# Patient Record
Sex: Female | Born: 1995 | Race: Black or African American | Hispanic: No | Marital: Single | Smoking: Former smoker
Health system: Southern US, Community
[De-identification: ages and names within clinical notes are randomized; demographics above are authoritative.]

## PROBLEM LIST (undated history)

## (undated) DIAGNOSIS — F419 Anxiety disorder, unspecified: Secondary | ICD-10-CM

## (undated) DIAGNOSIS — N739 Female pelvic inflammatory disease, unspecified: Secondary | ICD-10-CM

## (undated) DIAGNOSIS — F32A Depression, unspecified: Secondary | ICD-10-CM

## (undated) DIAGNOSIS — F329 Major depressive disorder, single episode, unspecified: Secondary | ICD-10-CM

## (undated) HISTORY — PX: NO PAST SURGERIES: SHX2092

---

## 2014-07-11 ENCOUNTER — Encounter (HOSPITAL_COMMUNITY): Payer: Self-pay | Admitting: Emergency Medicine

## 2014-07-11 ENCOUNTER — Emergency Department (HOSPITAL_COMMUNITY)
Admission: EM | Admit: 2014-07-11 | Discharge: 2014-07-11 | Disposition: A | Discharge status: Home / Self Care | Payer: BLUE CROSS/BLUE SHIELD | Attending: Emergency Medicine | Admitting: Emergency Medicine

## 2014-07-11 DIAGNOSIS — R109 Unspecified abdominal pain: Secondary | ICD-10-CM | POA: Insufficient documentation

## 2014-07-11 DIAGNOSIS — Z3202 Encounter for pregnancy test, result negative: Secondary | ICD-10-CM | POA: Diagnosis not present

## 2014-07-11 DIAGNOSIS — Z720 Tobacco use: Secondary | ICD-10-CM | POA: Insufficient documentation

## 2014-07-11 DIAGNOSIS — R110 Nausea: Secondary | ICD-10-CM | POA: Diagnosis not present

## 2014-07-11 LAB — BASIC METABOLIC PANEL
Anion gap: 7 (ref 5–15)
BUN: 9 mg/dL (ref 6–20)
CHLORIDE: 107 mmol/L (ref 101–111)
CO2: 24 mmol/L (ref 22–32)
Calcium: 8.6 mg/dL — ABNORMAL LOW (ref 8.9–10.3)
Creatinine, Ser: 0.66 mg/dL (ref 0.44–1.00)
GLUCOSE: 87 mg/dL (ref 65–99)
POTASSIUM: 3.5 mmol/L (ref 3.5–5.1)
SODIUM: 138 mmol/L (ref 135–145)

## 2014-07-11 LAB — CBC WITH DIFFERENTIAL/PLATELET
Basophils Absolute: 0 10*3/uL (ref 0.0–0.1)
Basophils Relative: 0 % (ref 0–1)
Eosinophils Absolute: 0.1 10*3/uL (ref 0.0–0.7)
Eosinophils Relative: 2 % (ref 0–5)
HCT: 37.4 % (ref 36.0–46.0)
Hemoglobin: 12.6 g/dL (ref 12.0–15.0)
LYMPHS PCT: 17 % (ref 12–46)
Lymphs Abs: 0.7 10*3/uL (ref 0.7–4.0)
MCH: 31.7 pg (ref 26.0–34.0)
MCHC: 33.7 g/dL (ref 30.0–36.0)
MCV: 94 fL (ref 78.0–100.0)
Monocytes Absolute: 0.5 10*3/uL (ref 0.1–1.0)
Monocytes Relative: 13 % — ABNORMAL HIGH (ref 3–12)
Neutro Abs: 2.8 10*3/uL (ref 1.7–7.7)
Neutrophils Relative %: 68 % (ref 43–77)
PLATELETS: 220 10*3/uL (ref 150–400)
RBC: 3.98 MIL/uL (ref 3.87–5.11)
RDW: 12.5 % (ref 11.5–15.5)
WBC: 4.1 10*3/uL (ref 4.0–10.5)

## 2014-07-11 LAB — URINE MICROSCOPIC-ADD ON

## 2014-07-11 LAB — URINALYSIS, ROUTINE W REFLEX MICROSCOPIC
BILIRUBIN URINE: NEGATIVE
Glucose, UA: NEGATIVE mg/dL
Ketones, ur: NEGATIVE mg/dL
NITRITE: NEGATIVE
PROTEIN: NEGATIVE mg/dL
SPECIFIC GRAVITY, URINE: 1.033 — AB (ref 1.005–1.030)
Urobilinogen, UA: 0.2 mg/dL (ref 0.0–1.0)
pH: 5.5 (ref 5.0–8.0)

## 2014-07-11 LAB — PREGNANCY, URINE: Preg Test, Ur: NEGATIVE

## 2014-07-11 MED ORDER — PROMETHAZINE HCL 25 MG PO TABS: 25.0000 mg | ORAL_TABLET | Freq: Four times a day (QID) | ORAL | Status: DC | PRN

## 2014-07-11 MED ORDER — ONDANSETRON HCL 4 MG/2ML IJ SOLN
4.0000 mg | Freq: Once | INTRAMUSCULAR | Status: AC
  Administered 2014-07-11: 4 mg via INTRAVENOUS
  Filled 2014-07-11: qty 2

## 2014-07-11 MED ORDER — SODIUM CHLORIDE 0.9 % IV BOLUS (SEPSIS)
1000.0000 mL | Freq: Once | INTRAVENOUS | Status: AC
  Administered 2014-07-11: 1000 mL via INTRAVENOUS

## 2014-07-11 MED ORDER — SODIUM CHLORIDE 0.9 % IV SOLN
INTRAVENOUS | Status: DC
  Administered 2014-07-11: 100 mL/h via INTRAVENOUS

## 2014-07-11 NOTE — ED Notes (Signed)
Bed: WLPT2 Expected date:  Expected time:  Means of arrival:  Comments: EMS 

## 2014-07-11 NOTE — Discharge Instructions (Signed)
Take the Phenergan as directed. 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 Return for any new or worse symptoms or for persistent symptoms. As we discussed it is possible the implant could be causing some symptoms if the nausea improved but persist then consider a discussion with the folks who put the implant in.

## 2014-07-11 NOTE — ED Provider Notes (Signed)
CSN: [id number]     Arrival date & time 07/11/14  1030 History   First MD Initiated Contact with Patient 07/11/14 1230     Chief Complaint  Patient presents with  . Nausea     (Consider location/radiation/quality/duration/timing/severity/associated sxs/prior Treatment) The history is provided by the patient.   19 year old female two-week history of some nausea following of implant. Patient with acute onset of the significant nausea during the night. Never truly had any abdominal pain just a nausea feeling. No real vomiting no diarrhea. Patient states that the nausea is severe.  History reviewed. No pertinent past medical history. History reviewed. No pertinent past surgical history. No family history on file. History  Substance Use Topics  . Smoking status: Current Every Day Smoker  . Smokeless tobacco: Not on file  . Alcohol Use: Yes   OB History    No data available     Review of Systems  Constitutional: Negative for fever.  HENT: Negative for congestion.   Eyes: Negative for redness.  Respiratory: Negative for shortness of breath.   Cardiovascular: Negative for chest pain.  Gastrointestinal: Positive for nausea and abdominal pain. Negative for vomiting and diarrhea.  Genitourinary: Negative for dysuria.  Musculoskeletal: Negative for back pain.  Skin: Negative for rash.  Neurological: Negative for headaches.  Hematological: Does not bruise/bleed easily.  Psychiatric/Behavioral: Negative for confusion.      Allergies  Review of patient's allergies indicates no known allergies.  Home Medications   Prior to Admission medications   Medication Sig Start Date End Date Taking? Authorizing Provider  promethazine (PHENERGAN) 25 MG tablet Take 1 tablet (25 mg total) by mouth every 6 (six) hours as needed for nausea or vomiting. 07/11/14   Vanetta MuldersScott Hobart Marte, MD   BP 106/66 mmHg  Pulse 63  Temp(Src) 99.2 F (37.3 C) (Oral)  Resp 16  SpO2 100% Physical Exam   Constitutional: She is oriented to person, place, and time. She appears well-developed and well-nourished. No distress.  HENT:  Head: Normocephalic and atraumatic.  Mouth/Throat: Oropharynx is clear and moist.  Eyes: Conjunctivae and EOM are normal. Pupils are equal, round, and reactive to light.  Neck: Normal range of motion.  Cardiovascular: Normal rate, regular rhythm and normal heart sounds.   No murmur heard. Pulmonary/Chest: Effort normal and breath sounds normal. No respiratory distress.  Abdominal: Soft. Bowel sounds are normal. There is no tenderness.  Musculoskeletal: Normal range of motion. She exhibits no edema.  Neurological: She is alert and oriented to person, place, and time. No cranial nerve deficit. She exhibits normal muscle tone. Coordination normal.  Skin: Skin is warm. No rash noted.  Nursing note and vitals reviewed.   ED Course  Procedures (including critical care time) Labs Review Labs Reviewed  URINALYSIS, ROUTINE W REFLEX MICROSCOPIC (NOT AT [facility]) - Abnormal; Notable for the following:    Color, Urine AMBER (*)    Specific Gravity, Urine 1.033 (*)    Hgb urine dipstick MODERATE (*)    Leukocytes, UA TRACE (*)    All other components within normal limits  CBC WITH DIFFERENTIAL/PLATELET - Abnormal; Notable for the following:    Monocytes Relative 13 (*)    All other components within normal limits  BASIC METABOLIC PANEL - Abnormal; Notable for the following:    Calcium 8.6 (*)    All other components within normal limits  URINE MICROSCOPIC-ADD ON - Abnormal; Notable for the following:    Squamous Epithelial / LPF FEW (*)    All  other components within normal limits  PREGNANCY, URINE   Results for orders placed or performed during the hospital encounter of 07/11/14  Urinalysis, Routine w reflex microscopic (not at [facility])  Result Value Ref Range   Color, Urine AMBER (A) YELLOW   APPearance CLEAR CLEAR   Specific Gravity, Urine 1.033 (H) 1.005 - 1.030    pH 5.5 5.0 - 8.0   Glucose, UA NEGATIVE NEGATIVE mg/dL   Hgb urine dipstick MODERATE (A) NEGATIVE   Bilirubin Urine NEGATIVE NEGATIVE   Ketones, ur NEGATIVE NEGATIVE mg/dL   Protein, ur NEGATIVE NEGATIVE mg/dL   Urobilinogen, UA 0.2 0.0 - 1.0 mg/dL   Nitrite NEGATIVE NEGATIVE   Leukocytes, UA TRACE (A) NEGATIVE  Pregnancy, urine  Result Value Ref Range   Preg Test, Ur NEGATIVE NEGATIVE  CBC with Differential/Platelet  Result Value Ref Range   WBC 4.1 4.0 - 10.5 K/uL   RBC 3.98 3.87 - 5.11 MIL/uL   Hemoglobin 12.6 12.0 - 15.0 g/dL   HCT 16.1 09.6 - 04.5 %   MCV 94.0 78.0 - 100.0 fL   MCH 31.7 26.0 - 34.0 pg   MCHC 33.7 30.0 - 36.0 g/dL   RDW 40.9 81.1 - 91.4 %   Platelets 220 150 - 400 K/uL   Neutrophils Relative % 68 43 - 77 %   Neutro Abs 2.8 1.7 - 7.7 K/uL   Lymphocytes Relative 17 12 - 46 %   Lymphs Abs 0.7 0.7 - 4.0 K/uL   Monocytes Relative 13 (H) 3 - 12 %   Monocytes Absolute 0.5 0.1 - 1.0 K/uL   Eosinophils Relative 2 0 - 5 %   Eosinophils Absolute 0.1 0.0 - 0.7 K/uL   Basophils Relative 0 0 - 1 %   Basophils Absolute 0.0 0.0 - 0.1 K/uL  Basic metabolic panel  Result Value Ref Range   Sodium 138 135 - 145 mmol/L   Potassium 3.5 3.5 - 5.1 mmol/L   Chloride 107 101 - 111 mmol/L   CO2 24 22 - 32 mmol/L   Glucose, Bld 87 65 - 99 mg/dL   BUN 9 6 - 20 mg/dL   Creatinine, Ser 7.82 0.44 - 1.00 mg/dL   Calcium 8.6 (L) 8.9 - 10.3 mg/dL   GFR calc non Af Amer >60 >60 mL/min   GFR calc Af Amer >60 >60 mL/min   Anion gap 7 5 - 15  Urine microscopic-add on  Result Value Ref Range   Squamous Epithelial / LPF FEW (A) RARE   WBC, UA 3-6 <3 WBC/hpf   RBC / HPF 3-6 <3 RBC/hpf   Bacteria, UA RARE RARE   Urine-Other MUCOUS PRESENT      Imaging Review No results found.   EKG Interpretation None      MDM   Final diagnoses:  Nausea    Patient really didn't have any abdominal pain it was just done nausea. No real vomiting. Patient's symptoms have been present  for a approximately 2 weeks but worse just last night. No abdominal tenderness no acute abdominal findings. Labs without any sniffing abnormalities brings he test negative no evidence urinary tract infection. No leukocytosis. Symptoms could be related to a viral gastritis.  Patient improved here with a hydration and antinausea medicine. Will continue and nausea medicine. In addition patient symptoms could be related to the implant that she had placed the 2 weeks ago and has had the nausea since then. She will follow-up with them if this persists.  Vanetta Mulders, MD 07/11/14 1538

## 2014-07-11 NOTE — ED Notes (Signed)
Pt c/o nausea ever since she got her implant last week. Pt told EMS she smoked marijuana last night and then became ill and vomited. Pt did not mention marijuana use to this RN. Pt denies pain. A&Ox4 and ambulatory.

## 2014-07-11 NOTE — ED Notes (Signed)
Pt presents via EMS with c/o abdominal pain. Per EMS, pt smoked marijuana last night and is now having the pain. When EMS arrived, she was laying outside on the ground flailing around, EMS able to put her on the truck and mom calmed pt down.

## 2014-09-08 ENCOUNTER — Emergency Department (HOSPITAL_COMMUNITY): Payer: BLUE CROSS/BLUE SHIELD

## 2014-09-08 ENCOUNTER — Encounter (HOSPITAL_COMMUNITY): Payer: Self-pay | Admitting: *Deleted

## 2014-09-08 ENCOUNTER — Inpatient Hospital Stay (HOSPITAL_COMMUNITY)
Admission: AD | Admit: 2014-09-08 | Discharge: 2014-09-12 | DRG: 758 | Disposition: A | Discharge status: Home / Self Care | Payer: BLUE CROSS/BLUE SHIELD | Source: Ambulatory Visit | Attending: Obstetrics and Gynecology | Admitting: Obstetrics and Gynecology

## 2014-09-08 DIAGNOSIS — A5402 Gonococcal vulvovaginitis, unspecified: Secondary | ICD-10-CM | POA: Diagnosis present

## 2014-09-08 DIAGNOSIS — R1011 Right upper quadrant pain: Secondary | ICD-10-CM | POA: Diagnosis present

## 2014-09-08 DIAGNOSIS — R109 Unspecified abdominal pain: Secondary | ICD-10-CM | POA: Diagnosis not present

## 2014-09-08 DIAGNOSIS — A5901 Trichomonal vulvovaginitis: Secondary | ICD-10-CM | POA: Diagnosis present

## 2014-09-08 DIAGNOSIS — N730 Acute parametritis and pelvic cellulitis: Secondary | ICD-10-CM | POA: Diagnosis not present

## 2014-09-08 DIAGNOSIS — A568 Sexually transmitted chlamydial infection of other sites: Secondary | ICD-10-CM | POA: Diagnosis present

## 2014-09-08 DIAGNOSIS — R52 Pain, unspecified: Secondary | ICD-10-CM

## 2014-09-08 DIAGNOSIS — F17200 Nicotine dependence, unspecified, uncomplicated: Secondary | ICD-10-CM | POA: Diagnosis present

## 2014-09-08 DIAGNOSIS — N83519 Torsion of ovary and ovarian pedicle, unspecified side: Secondary | ICD-10-CM

## 2014-09-08 HISTORY — DX: Anxiety disorder, unspecified: F41.9

## 2014-09-08 HISTORY — DX: Depression, unspecified: F32.A

## 2014-09-08 HISTORY — DX: Major depressive disorder, single episode, unspecified: F32.9

## 2014-09-08 LAB — COMPREHENSIVE METABOLIC PANEL
ALT: 10 U/L — AB (ref 14–54)
AST: 18 U/L (ref 15–41)
Albumin: 3.9 g/dL (ref 3.5–5.0)
Alkaline Phosphatase: 71 U/L (ref 38–126)
Anion gap: 11 (ref 5–15)
BILIRUBIN TOTAL: 1.2 mg/dL (ref 0.3–1.2)
BUN: 7 mg/dL (ref 6–20)
CALCIUM: 9.1 mg/dL (ref 8.9–10.3)
CO2: 22 mmol/L (ref 22–32)
Chloride: 103 mmol/L (ref 101–111)
Creatinine, Ser: 0.77 mg/dL (ref 0.44–1.00)
GFR calc Af Amer: >60 mL/min (ref >60)
GFR calc non Af Amer: >60 mL/min (ref >60)
GLUCOSE: 147 mg/dL — AB (ref 65–99)
Potassium: 3.6 mmol/L (ref 3.5–5.1)
SODIUM: 136 mmol/L (ref 135–145)
Total Protein: 7.4 g/dL (ref 6.5–8.1)

## 2014-09-08 LAB — CBC
HCT: 37.8 % (ref 36.0–46.0)
Hemoglobin: 12.8 g/dL (ref 12.0–15.0)
MCH: 31.2 pg (ref 26.0–34.0)
MCHC: 33.9 g/dL (ref 30.0–36.0)
MCV: 92.2 fL (ref 78.0–100.0)
Platelets: 224 10*3/uL (ref 150–400)
RBC: 4.1 MIL/uL (ref 3.87–5.11)
RDW: 12.9 % (ref 11.5–15.5)
WBC: 7.4 10*3/uL (ref 4.0–10.5)

## 2014-09-08 LAB — URINALYSIS, ROUTINE W REFLEX MICROSCOPIC
GLUCOSE, UA: NEGATIVE mg/dL
Hgb urine dipstick: NEGATIVE
KETONES UR: 15 mg/dL — AB
Nitrite: NEGATIVE
PROTEIN: NEGATIVE mg/dL
Specific Gravity, Urine: 1.029 (ref 1.005–1.030)
UROBILINOGEN UA: 1 mg/dL (ref 0.0–1.0)
pH: 6 (ref 5.0–8.0)

## 2014-09-08 LAB — WET PREP, GENITAL
CLUE CELLS WET PREP: NONE SEEN
Yeast Wet Prep HPF POC: NONE SEEN

## 2014-09-08 LAB — LIPASE, BLOOD: Lipase: 20 U/L — ABNORMAL LOW (ref 22–51)

## 2014-09-08 LAB — URINE MICROSCOPIC-ADD ON

## 2014-09-08 LAB — POC URINE PREG, ED: PREG TEST UR: NEGATIVE

## 2014-09-08 MED ORDER — HYDROMORPHONE HCL 1 MG/ML IJ SOLN
1.0000 mg | Freq: Once | INTRAMUSCULAR | Status: AC
  Administered 2014-09-08: 1 mg via INTRAVENOUS
  Filled 2014-09-08: qty 1

## 2014-09-08 MED ORDER — DOXYCYCLINE HYCLATE 100 MG PO TABS
100.0000 mg | ORAL_TABLET | Freq: Once | ORAL | Status: AC
  Administered 2014-09-08: 100 mg via ORAL
  Filled 2014-09-08: qty 1

## 2014-09-08 MED ORDER — CEFTRIAXONE SODIUM 250 MG IJ SOLR
250.0000 mg | Freq: Once | INTRAMUSCULAR | Status: AC
  Administered 2014-09-08: 250 mg via INTRAMUSCULAR
  Filled 2014-09-08: qty 250

## 2014-09-08 MED ORDER — METRONIDAZOLE 500 MG PO TABS
2000.0000 mg | ORAL_TABLET | Freq: Once | ORAL | Status: AC
  Administered 2014-09-08: 2000 mg via ORAL
  Filled 2014-09-08: qty 4

## 2014-09-08 MED ORDER — IOHEXOL 300 MG/ML  SOLN
80.0000 mL | Freq: Once | INTRAMUSCULAR | Status: AC | PRN
  Administered 2014-09-08: 80 mL via INTRAVENOUS

## 2014-09-08 MED ORDER — AZITHROMYCIN 250 MG PO TABS
1000.0000 mg | ORAL_TABLET | Freq: Once | ORAL | Status: AC
  Administered 2014-09-08: 1000 mg via ORAL
  Filled 2014-09-08: qty 4

## 2014-09-08 MED ORDER — LIDOCAINE HCL (PF) 1 % IJ SOLN
INTRAMUSCULAR | Status: AC
  Administered 2014-09-08: 0.9 mL
  Filled 2014-09-08: qty 5

## 2014-09-08 MED ORDER — IOHEXOL 300 MG/ML  SOLN
25.0000 mL | Freq: Once | INTRAMUSCULAR | Status: AC | PRN
  Administered 2014-09-08: 25 mL via ORAL

## 2014-09-08 MED ORDER — DEXTROSE 5 % IV SOLN
1.0000 g | Freq: Once | INTRAVENOUS | Status: DC
  Filled 2014-09-08: qty 1

## 2014-09-08 NOTE — MAU Note (Addendum)
Arrived by [facility] from [facility] ED.  Lower abd pain for 3 days counting today.  No vaginal bleeding.  Not pregnant.  Yellow vaginal discharge with odor.

## 2014-09-08 NOTE — ED Notes (Signed)
Pt reports right side abd pain, increases with movement. Denies n/v/d.

## 2014-09-08 NOTE — ED Provider Notes (Signed)
CSN: [id number]     Arrival date & time 09/08/14  1707 History   First MD Initiated Contact with Patient 09/08/14 1711     Chief Complaint  Patient presents with  . Abdominal Pain     (Consider location/radiation/quality/duration/timing/severity/associated sxs/prior Treatment) Patient is a 19 y.o. female presenting with abdominal pain. The history is provided by the patient.  Abdominal Pain Pain location:  RUQ, R flank and RLQ Pain quality: sharp   Pain radiates to:  R flank Pain severity:  Severe Onset quality:  Gradual Duration:  2 days Timing:  Constant Progression:  Unchanged Chronicity:  New Context: recent sexual activity   Context: not eating, not previous surgeries, not recent travel and not sick contacts   Relieved by:  Nothing Worsened by:  Nothing tried Associated symptoms: vaginal discharge   Associated symptoms: no chills, no cough, no diarrhea, no fever, no nausea, no shortness of breath and no vomiting     History reviewed. No pertinent past medical history. History reviewed. No pertinent past surgical history. History reviewed. No pertinent family history. History  Substance Use Topics  . Smoking status: Current Every Day Smoker  . Smokeless tobacco: Not on file  . Alcohol Use: Yes   OB History    No data available     Review of Systems  Constitutional: Negative for fever and chills.  Respiratory: Negative for cough and shortness of breath.   Gastrointestinal: Positive for abdominal pain. Negative for nausea, vomiting and diarrhea.  Genitourinary: Positive for vaginal discharge.  All other systems reviewed and are negative.     Allergies  Review of patient's allergies indicates no known allergies.  Home Medications   Prior to Admission medications   Medication Sig Start Date End Date Taking? Authorizing Provider  promethazine (PHENERGAN) 25 MG tablet Take 1 tablet (25 mg total) by mouth every 6 (six) hours as needed for nausea or vomiting.  07/11/14   Vanetta Mulders, MD   BP 111/73 mmHg  Pulse 95  Temp(Src) 99.5 F (37.5 C) (Oral)  Resp 16  SpO2 100% Physical Exam  Constitutional: She is oriented to person, place, and time. She appears well-developed and well-nourished. No distress.  HENT:  Head: Normocephalic and atraumatic.  Mouth/Throat: Oropharynx is clear and moist.  Eyes: EOM are normal. Pupils are equal, round, and reactive to light.  Neck: Normal range of motion. Neck supple.  Cardiovascular: Normal rate and regular rhythm.  Exam reveals no friction rub.   No murmur heard. Pulmonary/Chest: Effort normal and breath sounds normal. No respiratory distress. She has no wheezes. She has no rales.  Abdominal: Soft. She exhibits no distension. There is no tenderness. There is no rebound.  Musculoskeletal: Normal range of motion. She exhibits no edema.  Neurological: She is alert and oriented to person, place, and time. No cranial nerve deficit. She exhibits normal muscle tone. Coordination normal.  Skin: No rash noted. She is not diaphoretic.  Nursing note and vitals reviewed.   ED Course  Procedures (including critical care time) Labs Review Labs Reviewed  WET PREP, GENITAL  CBC  COMPREHENSIVE METABOLIC PANEL  LIPASE, BLOOD  URINALYSIS, ROUTINE W REFLEX MICROSCOPIC (NOT AT [facility])  HIV ANTIBODY (ROUTINE TESTING)  GC/CHLAMYDIA PROBE AMP ([place]) NOT AT [facility]    Imaging Review US Transvaginal Non-ob  09/08/2014   CLINICAL DATA:  Acute onset of right lower quadrant abdominal pain for 2 days. Initial encounter.  EXAM: TRANSABDOMINAL AND TRANSVAGINAL ULTRASOUND OF PELVIS  DOPPLER ULTRASOUND OF OVARIES  TECHNIQUE: Both transabdominal and transvaginal ultrasound examinations of the pelvis were performed. Transabdominal technique was performed for global imaging of the pelvis including uterus, ovaries, adnexal regions, and pelvic cul-de-sac.  It was necessary to proceed with endovaginal exam following the transabdominal  exam to visualize the endometrium. Color and duplex Doppler ultrasound was utilized to evaluate blood flow to the ovaries.  COMPARISON:  None.  FINDINGS: Uterus  Measurements: 7.1 x 5.5 x 5.7 cm. No fibroids or other mass visualized. The uterus is retroflexed in nature.  Endometrium  Thickness: 0.8 cm.  No focal abnormality visualized.  Right ovary  Measurements: 2.9 x 1.7 x 2.8 cm. Normal appearance/no adnexal mass.  Left ovary  Measurements: 4.1 x 2.3 x 3.4 cm. Normal appearance/no adnexal mass.  Pulsed Doppler evaluation of both ovaries demonstrates normal low-resistance arterial and venous waveforms.  Other findings  No free fluid is seen within the pelvic cul-de-sac.  IMPRESSION: Unremarkable pelvic ultrasound.  No evidence for ovarian torsion.   Electronically Signed   By: Roanna Raider M.D.   On: 09/08/2014 21:17   US Pelvis Complete  09/08/2014   CLINICAL DATA:  Acute onset of right lower quadrant abdominal pain for 2 days. Initial encounter.  EXAM: TRANSABDOMINAL AND TRANSVAGINAL ULTRASOUND OF PELVIS  DOPPLER ULTRASOUND OF OVARIES  TECHNIQUE: Both transabdominal and transvaginal ultrasound examinations of the pelvis were performed. Transabdominal technique was performed for global imaging of the pelvis including uterus, ovaries, adnexal regions, and pelvic cul-de-sac.  It was necessary to proceed with endovaginal exam following the transabdominal exam to visualize the endometrium. Color and duplex Doppler ultrasound was utilized to evaluate blood flow to the ovaries.  COMPARISON:  None.  FINDINGS: Uterus  Measurements: 7.1 x 5.5 x 5.7 cm. No fibroids or other mass visualized. The uterus is retroflexed in nature.  Endometrium  Thickness: 0.8 cm.  No focal abnormality visualized.  Right ovary  Measurements: 2.9 x 1.7 x 2.8 cm. Normal appearance/no adnexal mass.  Left ovary  Measurements: 4.1 x 2.3 x 3.4 cm. Normal appearance/no adnexal mass.  Pulsed Doppler evaluation of both ovaries demonstrates normal  low-resistance arterial and venous waveforms.  Other findings  No free fluid is seen within the pelvic cul-de-sac.  IMPRESSION: Unremarkable pelvic ultrasound.  No evidence for ovarian torsion.   Electronically Signed   By: Roanna Raider M.D.   On: 09/08/2014 21:17   Ct Abdomen Pelvis W Contrast  09/08/2014   CLINICAL DATA:  Lower abdominal pain for 2 days.  EXAM: CT ABDOMEN AND PELVIS WITH CONTRAST  TECHNIQUE: Multidetector CT imaging of the abdomen and pelvis was performed using the standard protocol following bolus administration of intravenous contrast.  CONTRAST:  80mL OMNIPAQUE IOHEXOL 300 MG/ML  SOLN  COMPARISON:  None  FINDINGS: There is a small amount of blood or fluid in the peritoneum. There are normal appearances of the liver, spleen, pancreas, adrenals and kidneys. The abdominal aorta is normal in caliber. There is no atherosclerotic calcification. There is no adenopathy in the abdomen or pelvis. There are normal appearances of the stomach, small bowel and colon. The appendix is normal. The uterus and ovaries appear unremarkable. There are prominent adnexal vascular structures which may represent varicosities, but the gonadal veins are not particularly enlarged. There is no significant abnormality in the lower chest. There is no significant musculoskeletal abnormality  IMPRESSION: Small volume blood or fluid in the peritoneum without obvious source.   Electronically Signed   By: Ellery Plunk M.D.   On: 09/08/2014  21:59   [place] Art/ven Flow Abd Pelv Doppler  09/08/2014   CLINICAL DATA:  Acute onset of right lower quadrant abdominal pain for 2 days. Initial encounter.  EXAM: TRANSABDOMINAL AND TRANSVAGINAL ULTRASOUND OF PELVIS  DOPPLER ULTRASOUND OF OVARIES  TECHNIQUE: Both transabdominal and transvaginal ultrasound examinations of the pelvis were performed. Transabdominal technique was performed for global imaging of the pelvis including uterus, ovaries, adnexal regions, and pelvic cul-de-sac.  It  was necessary to proceed with endovaginal exam following the transabdominal exam to visualize the endometrium. Color and duplex Doppler ultrasound was utilized to evaluate blood flow to the ovaries.  COMPARISON:  None.  FINDINGS: Uterus  Measurements: 7.1 x 5.5 x 5.7 cm. No fibroids or other mass visualized. The uterus is retroflexed in nature.  Endometrium  Thickness: 0.8 cm.  No focal abnormality visualized.  Right ovary  Measurements: 2.9 x 1.7 x 2.8 cm. Normal appearance/no adnexal mass.  Left ovary  Measurements: 4.1 x 2.3 x 3.4 cm. Normal appearance/no adnexal mass.  Pulsed Doppler evaluation of both ovaries demonstrates normal low-resistance arterial and venous waveforms.  Other findings  No free fluid is seen within the pelvic cul-de-sac.  IMPRESSION: Unremarkable pelvic ultrasound.  No evidence for ovarian torsion.   Electronically Signed   By: Roanna Raider M.D.   On: 09/08/2014 21:17     EKG Interpretation None      MDM   Final diagnoses:  PID (acute pelvic inflammatory disease)  Abdominal pain, unspecified abdominal location    59F here with abdominal pain, began 2 days ago, worsening. R sided, radiates around to her R flank. No fevers, vomiting, diarrhea.  She endorses some vaginal discharge and a new partner. AFVSS here. Exquisite R sided tenderness, most in RUQ. Also has some RLQ tenderness. Will do pelvic, concern for PID/Fitz-Hugh-Curtis.  On pelvic, has diffuse purulent vaginal discharge. She also has some mild diffuse tenderness. [place] negative for TOA. CT negative for appendicitis. Still having a lot of abdominal pain and difficulty with moving around. Dr. Debroah Loop with GYN accepting, will send to [facility].  Elwin Mocha, MD 09/08/14 [phone]

## 2014-09-09 ENCOUNTER — Encounter (HOSPITAL_COMMUNITY): Payer: Self-pay

## 2014-09-09 DIAGNOSIS — N730 Acute parametritis and pelvic cellulitis: Secondary | ICD-10-CM | POA: Diagnosis present

## 2014-09-09 DIAGNOSIS — A5901 Trichomonal vulvovaginitis: Secondary | ICD-10-CM | POA: Diagnosis present

## 2014-09-09 DIAGNOSIS — A568 Sexually transmitted chlamydial infection of other sites: Secondary | ICD-10-CM | POA: Diagnosis present

## 2014-09-09 DIAGNOSIS — A5402 Gonococcal vulvovaginitis, unspecified: Secondary | ICD-10-CM | POA: Diagnosis present

## 2014-09-09 DIAGNOSIS — R109 Unspecified abdominal pain: Secondary | ICD-10-CM | POA: Diagnosis present

## 2014-09-09 DIAGNOSIS — F17200 Nicotine dependence, unspecified, uncomplicated: Secondary | ICD-10-CM | POA: Diagnosis present

## 2014-09-09 DIAGNOSIS — A599 Trichomoniasis, unspecified: Secondary | ICD-10-CM | POA: Diagnosis not present

## 2014-09-09 DIAGNOSIS — R1011 Right upper quadrant pain: Secondary | ICD-10-CM | POA: Diagnosis present

## 2014-09-09 LAB — URINALYSIS, ROUTINE W REFLEX MICROSCOPIC
Bilirubin Urine: NEGATIVE
GLUCOSE, UA: NEGATIVE mg/dL
Hgb urine dipstick: NEGATIVE
Ketones, ur: NEGATIVE mg/dL
Leukocytes, UA: NEGATIVE
NITRITE: NEGATIVE
Protein, ur: NEGATIVE mg/dL
Specific Gravity, Urine: 1.01 (ref 1.005–1.030)
Urobilinogen, UA: 0.2 mg/dL (ref 0.0–1.0)
pH: 5.5 (ref 5.0–8.0)

## 2014-09-09 LAB — HIV ANTIBODY (ROUTINE TESTING W REFLEX): HIV SCREEN 4TH GENERATION: NONREACTIVE

## 2014-09-09 MED ORDER — ONDANSETRON HCL 4 MG/2ML IJ SOLN
4.0000 mg | Freq: Four times a day (QID) | INTRAMUSCULAR | Status: DC | PRN
  Administered 2014-09-09 – 2014-09-10 (×3): 4 mg via INTRAVENOUS
  Filled 2014-09-09 (×4): qty 2

## 2014-09-09 MED ORDER — OXYCODONE-ACETAMINOPHEN 5-325 MG PO TABS
1.0000 | ORAL_TABLET | ORAL | Status: DC | PRN
  Administered 2014-09-09 – 2014-09-11 (×6): 2 via ORAL
  Administered 2014-09-11: 1 via ORAL
  Administered 2014-09-11 (×2): 2 via ORAL
  Filled 2014-09-09 (×2): qty 2
  Filled 2014-09-09: qty 1
  Filled 2014-09-09 (×7): qty 2

## 2014-09-09 MED ORDER — DEXTROSE 5 % IV SOLN
2.0000 g | Freq: Four times a day (QID) | INTRAVENOUS | Status: DC
  Administered 2014-09-09 – 2014-09-11 (×10): 2 g via INTRAVENOUS
  Filled 2014-09-09 (×12): qty 2

## 2014-09-09 MED ORDER — DOXYCYCLINE HYCLATE 100 MG IV SOLR
100.0000 mg | Freq: Two times a day (BID) | INTRAVENOUS | Status: DC
  Administered 2014-09-09 – 2014-09-11 (×6): 100 mg via INTRAVENOUS
  Filled 2014-09-09 (×7): qty 100

## 2014-09-09 MED ORDER — HYDROMORPHONE HCL 1 MG/ML IJ SOLN
2.0000 mg | Freq: Once | INTRAMUSCULAR | Status: AC
  Administered 2014-09-09: 1 mg via INTRAVENOUS
  Filled 2014-09-09: qty 2

## 2014-09-09 MED ORDER — PRENATAL MULTIVITAMIN CH
1.0000 | ORAL_TABLET | Freq: Every day | ORAL | Status: DC
  Administered 2014-09-09 – 2014-09-11 (×3): 1 via ORAL
  Filled 2014-09-09 (×4): qty 1

## 2014-09-09 MED ORDER — KETOROLAC TROMETHAMINE 30 MG/ML IJ SOLN
30.0000 mg | Freq: Once | INTRAMUSCULAR | Status: AC
  Administered 2014-09-09: 30 mg via INTRAVENOUS
  Filled 2014-09-09: qty 1

## 2014-09-09 MED ORDER — CEFOXITIN SODIUM 1 G IV SOLR
1.0000 g | Freq: Once | INTRAVENOUS | Status: DC
  Administered 2014-09-09: 1 g via INTRAVENOUS
  Filled 2014-09-09: qty 1

## 2014-09-09 MED ORDER — LACTATED RINGERS IV SOLN
INTRAVENOUS | Status: DC
  Administered 2014-09-09 – 2014-09-11 (×4): via INTRAVENOUS

## 2014-09-09 MED ORDER — ONDANSETRON HCL 4 MG PO TABS
4.0000 mg | ORAL_TABLET | Freq: Four times a day (QID) | ORAL | Status: DC | PRN
  Administered 2014-09-11 (×2): 4 mg via ORAL
  Filled 2014-09-09 (×2): qty 1

## 2014-09-09 MED ORDER — HYDROMORPHONE HCL 1 MG/ML IJ SOLN
1.0000 mg | INTRAMUSCULAR | Status: DC | PRN
Start: 2014-09-09 — End: 2014-09-11
  Administered 2014-09-09 – 2014-09-10 (×5): 1 mg via INTRAVENOUS
  Filled 2014-09-09 (×6): qty 1

## 2014-09-09 NOTE — Progress Notes (Signed)
Pt reports she has not had sex with her current boyfriend that is here with her tonight and does not want medical information discussed with him present.  Pt reports she has had a history of trich with her previous boyfriend and was treated in past.

## 2014-09-09 NOTE — MAU Provider Note (Addendum)
History     CSN: [id number]  Arrival date and time: 09/08/14 1707   None     Chief Complaint  Patient presents with  . Abdominal Pain   Abdominal Pain This is a new problem. Episode onset: 2 days ago. The onset quality is gradual. The problem occurs constantly. The problem has been gradually worsening. The pain is located in the RLQ, right flank and RUQ. The pain is moderate. The quality of the pain is aching. Associated symptoms include dysuria, frequency and nausea (mild). Pertinent negatives include no fever. Nothing aggravates the pain. The pain is relieved by nothing. Prior diagnostic workup includes CT scan and ultrasound.  G1P1001 Patient's last menstrual period was 09/03/2014.   Pertinent Gynecological History: Menses: flow is moderate  Contraception: Nexplanon  Blood transfusions: none Sexually transmitted diseases: recent diagnosis: trichomonas and past history: gonorrhea Previous GYN Procedures: no      Past Medical History  Diagnosis Date  . Anxiety   . Depression     Past Surgical History  Procedure Laterality Date  . No past surgeries      History reviewed. No pertinent family history.  History  Substance Use Topics  . Smoking status: Current Every Day Smoker  . Smokeless tobacco: Not on file  . Alcohol Use: Yes     Comment: sociallly     Allergies:  Allergies  Allergen Reactions  . Adderall [Amphetamine-Dextroamphetamine] Other (See Comments)    Depression     Prescriptions prior to admission  Medication Sig Dispense Refill Last Dose  . promethazine (PHENERGAN) 25 MG tablet Take 1 tablet (25 mg total) by mouth every 6 (six) hours as needed for nausea or vomiting. 12 tablet 1    OB History    Gravida Para Term Preterm AB TAB SAB Ectopic Multiple Living   1         1     G1P1 11 mo ago [place]  Review of Systems  Constitutional: Negative for fever.  Gastrointestinal: Positive for nausea (mild) and abdominal pain.  Genitourinary:  Positive for dysuria, urgency, frequency and flank pain.       Vaginal discharge and irritation  Psychiatric/Behavioral: Negative.    Physical Exam   Blood pressure 114/72, pulse 86, temperature 98.7 F (37.1 C), temperature source Oral, resp. rate 18, last menstrual period 09/03/2014, SpO2 100 %.  Physical Exam  Constitutional: She is oriented to person, place, and time. She appears well-developed. She appears distressed (mild discomfort).  Neck: Normal range of motion.  Cardiovascular: Normal rate.   Respiratory: Effort normal. No respiratory distress.  GI: Soft. She exhibits no mass. There is tenderness (RLQ). There is guarding. There is no rebound.  Right CVAT  Genitourinary: Uterus normal. Vaginal discharge found.  No CMT, no mass, inconsistent right side tenderness  Neurological: She is alert and oriented to person, place, and time.  Skin: Skin is warm and dry.  Psychiatric: She has a normal mood and affect. Her behavior is normal.  CLINICAL DATA: Acute onset of right lower quadrant abdominal pain for 2 days. Initial encounter.  EXAM: TRANSABDOMINAL AND TRANSVAGINAL ULTRASOUND OF PELVIS  DOPPLER ULTRASOUND OF OVARIES  TECHNIQUE: Both transabdominal and transvaginal ultrasound examinations of the pelvis were performed. Transabdominal technique was performed for global imaging of the pelvis including uterus, ovaries, adnexal regions, and pelvic cul-de-sac.  It was necessary to proceed with endovaginal exam following the transabdominal exam to visualize the endometrium. Color and duplex Doppler ultrasound was utilized to evaluate blood flow to  the ovaries.  COMPARISON: None.  FINDINGS: Uterus  Measurements: 7.1 x 5.5 x 5.7 cm. No fibroids or other mass visualized. The uterus is retroflexed in nature.  Endometrium  Thickness: 0.8 cm. No focal abnormality visualized.  Right ovary  Measurements: 2.9 x 1.7 x 2.8 cm. Normal appearance/no adnexal  mass.  Left ovary  Measurements: 4.1 x 2.3 x 3.4 cm. Normal appearance/no adnexal mass.  Pulsed Doppler evaluation of both ovaries demonstrates normal low-resistance arterial and venous waveforms.  Other findings  No free fluid is seen within the pelvic cul-de-sac.  IMPRESSION: Unremarkable pelvic ultrasound. No evidence for ovarian torsion.   Electronically Signed  By: Roanna Raider M.D. Pelvic US nl except small pelvic free fluid  Urinalysis    Component Value Date/Time   COLORURINE YELLOW 09/09/2014 0110   APPEARANCEUR CLEAR 09/09/2014 0110   LABSPEC 1.010 09/09/2014 0110   PHURINE 5.5 09/09/2014 0110   GLUCOSEU NEGATIVE 09/09/2014 0110   HGBUR NEGATIVE 09/09/2014 0110   BILIRUBINUR NEGATIVE 09/09/2014 0110   KETONESUR NEGATIVE 09/09/2014 0110   PROTEINUR NEGATIVE 09/09/2014 0110   UROBILINOGEN 0.2 09/09/2014 0110   NITRITE NEGATIVE 09/09/2014 0110   LEUKOCYTESUR NEGATIVE 09/09/2014 0110       MAU Course  Procedures  MDM Moderate, radiology and labs reviewed  Assessment and Plan  Inconsistent findings but history and current resulted suggest possible PID. Treated for trichomonas. Urinalysis negative. Begin mefoxin 2 grams Q6 h and doxycyline 100 mg BID  Sally Allen 09/09/2014, 12:48 AM

## 2014-09-10 ENCOUNTER — Inpatient Hospital Stay (HOSPITAL_COMMUNITY): Payer: BLUE CROSS/BLUE SHIELD

## 2014-09-10 DIAGNOSIS — R1011 Right upper quadrant pain: Secondary | ICD-10-CM | POA: Diagnosis present

## 2014-09-10 LAB — COMPREHENSIVE METABOLIC PANEL
ALBUMIN: 3.5 g/dL (ref 3.5–5.0)
ALK PHOS: 69 U/L (ref 38–126)
ALT: 12 U/L — ABNORMAL LOW (ref 14–54)
AST: 21 U/L (ref 15–41)
Anion gap: 7 (ref 5–15)
BILIRUBIN TOTAL: 0.7 mg/dL (ref 0.3–1.2)
CO2: 29 mmol/L (ref 22–32)
Calcium: 8.5 mg/dL — ABNORMAL LOW (ref 8.9–10.3)
Chloride: 98 mmol/L — ABNORMAL LOW (ref 101–111)
Creatinine, Ser: 0.82 mg/dL (ref 0.44–1.00)
GFR calc Af Amer: >60 mL/min (ref >60)
GFR calc non Af Amer: >60 mL/min (ref >60)
GLUCOSE: 90 mg/dL (ref 65–99)
Potassium: 3.4 mmol/L — ABNORMAL LOW (ref 3.5–5.1)
Sodium: 134 mmol/L — ABNORMAL LOW (ref 135–145)
TOTAL PROTEIN: 6.9 g/dL (ref 6.5–8.1)

## 2014-09-10 LAB — CBC WITH DIFFERENTIAL/PLATELET
Basophils Absolute: 0 10*3/uL (ref 0.0–0.1)
Basophils Relative: 0 % (ref 0–1)
EOS PCT: 3 % (ref 0–5)
Eosinophils Absolute: 0.2 10*3/uL (ref 0.0–0.7)
HEMATOCRIT: 33.7 % — AB (ref 36.0–46.0)
Hemoglobin: 11.3 g/dL — ABNORMAL LOW (ref 12.0–15.0)
LYMPHS ABS: 0.6 10*3/uL — AB (ref 0.7–4.0)
LYMPHS PCT: 7 % — AB (ref 12–46)
MCH: 31.3 pg (ref 26.0–34.0)
MCHC: 33.5 g/dL (ref 30.0–36.0)
MCV: 93.4 fL (ref 78.0–100.0)
MONO ABS: 0.7 10*3/uL (ref 0.1–1.0)
Monocytes Relative: 8 % (ref 3–12)
NEUTROS PCT: 82 % — AB (ref 43–77)
Neutro Abs: 6.5 10*3/uL (ref 1.7–7.7)
Platelets: 204 10*3/uL (ref 150–400)
RBC: 3.61 MIL/uL — ABNORMAL LOW (ref 3.87–5.11)
RDW: 12.6 % (ref 11.5–15.5)
WBC: 8 10*3/uL (ref 4.0–10.5)

## 2014-09-10 LAB — LIPASE, BLOOD: Lipase: 12 U/L — ABNORMAL LOW (ref 22–51)

## 2014-09-10 LAB — AMYLASE: AMYLASE: 28 U/L (ref 28–100)

## 2014-09-10 LAB — GC/CHLAMYDIA PROBE AMP (~~LOC~~) NOT AT ARMC
Chlamydia: POSITIVE — AB
NEISSERIA GONORRHEA: POSITIVE — AB

## 2014-09-10 LAB — SEDIMENTATION RATE: Sed Rate: 40 mm/h — ABNORMAL HIGH (ref 0–22)

## 2014-09-10 NOTE — Plan of Care (Signed)
Problem: Phase II Progression Outcomes Goal: Progress activity as tolerated unless otherwise ordered Outcome: Completed/Met Date Met:  09/10/14 Tolerates walking to the bathroom well. Goal: Discharge plan established Outcome: Completed/Met Date Met:  09/10/14 Signs of infection gone VSS Pain controlled  Understands self care and f/u care Goal: Other Phase II Outcomes/Goals Outcome: Completed/Met Date Met:  09/10/14 Voids qs amber urine without difficulty.

## 2014-09-10 NOTE — Progress Notes (Addendum)
Hosp day 2 , admitted for questionable PID, trichomoniasis, due to right sided abdominal pain,  Subjective: Patient reports nausea.  That has responded to iv nausea agents.  Pt describes right sided abd pain as unchanged, ""horrible", but at admission she describes pain in to back, and that has gone away. Pt now describes the pain as extending in to rt upper quadrant,   Objective: I have reviewed patient's vital signs, intake and output, medications and labs.  General: mild distress, moderate distress and tearful with exam Resp: clear to auscultation bilaterally GI: normal findings: bowel sounds normal and guarding over right side, mid abdomen and particularly in RUQ, , abnormal findings:  guarding and no CVAT Extremities: extremities normal, atraumatic, no cyanosis or edema and Homans sign is negative, no sign of DVT Note: no GC /CHL in lab section  Assessment/Plan: Abdominal pain of uncertain etiology, being treated for PID and trichomonas Right sided and RUQ predominance of pain, without CVAT   Plan : continue iv antibiotics for now . Abd u/s to check out gallbladder, recheck labs: cbc esr, cmet lipase amylase .   LOS: 1 day    Stormy Sabol V 09/10/2014, 7:55 AM

## 2014-09-10 NOTE — Progress Notes (Signed)
Chaplain responded to Nursing Unit request to provide spiritual care to patient who is emotionally up because of current medical status and report of constant pain with no relief over the past few days.  Chaplain provided a calming and  listening  presence to the patient as she voiced her concerns.  Chaplain also offered a prayer of healing, comfort and peace for the patient.  She appeared more calm at the end of the visit. Chaplain will continue to follow up as needed. Chaplain Janell Quiet [phone]

## 2014-09-11 DIAGNOSIS — A599 Trichomoniasis, unspecified: Secondary | ICD-10-CM

## 2014-09-11 MED ORDER — DOXYCYCLINE HYCLATE 100 MG PO TABS
100.0000 mg | ORAL_TABLET | Freq: Two times a day (BID) | ORAL | Status: DC
  Administered 2014-09-11: 100 mg via ORAL
  Filled 2014-09-11 (×2): qty 1

## 2014-09-11 NOTE — Progress Notes (Signed)
Patient ID: Sally Allen, female   DOB: 1995/03/21, 21 y.o.   MRN: [id number]  FACULTY PRACTICE PROGRESS NOTE  Sally Allen WUJ:[id number] DOB: [birth year] DOA: 09/08/2014 PCP: No primary care provider on file.  Assessment/Plan: 1. PID  Gonorrhea and Chlamydia positive  Continue Antibiotics : cefoxitin and doxycycline, Day 2 of antibiotics, which should treat the infection adequately  Discussed with patient that partners need to be treated for all three infections to prevent recurrence.   2. 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  Continue treatment of PID 3. Trichomoniasis  Treated with metronidazole  Code Status: Full Family Communication: spoke with mother by phone at patient's request. Disposition Plan: D/c to home when improvement of pain.  Procedures: none  Antibiotics:  Cefoxitin and doxycycline started 09/09/14 (indicate start date, and stop date if known)  HPI/Subjective: Patient reports minimal improvement of pain - not as consistent, but continues to be as severe.  No fevers, chills since being admitted.  Pain in right lower pelvis, but radiates up right side into RUQ.  Abdominal done yesterday.  Continues to eat and drink with minimal exacerbation of pain.  Pain worse with movement.  Patient admits to having prior episode of GC/CT in the past, but had been treated.  She never had a TOC.  Objective: Filed Vitals:   09/11/14 0618  BP: 101/68  Pulse: 79  Temp: 98.4 F (36.9 C)  Resp: 16    Intake/Output Summary (Last 24 hours) at 09/11/14 0723 Last data filed at 09/11/14 0548  Gross per 24 hour  Intake 2966.33 ml  Output   1770 ml  Net 1196.33 ml   Filed Weights   09/09/14 0305  Weight: 120 lb (54.432 kg)    Exam:   General:  A&Ox3, NAD, cooperative  Cardiovascular: RR, Nl S1, S2.  No murmur  Respiratory: CTA bilaterally, no rales or wheezes  Abdomen:  Tender in the RLQ and RUQ.  No rebound, minimal guarding.  Musculoskeletal: no joint pain, calf pain.  Vaginal: deferred  Extremity: no edema, redness, or swelling to legs bilaterally  Skin: dry, no rashes.   Data Reviewed: Basic Metabolic Panel:  Recent Labs Lab 09/08/14 1719 09/10/14 0808  NA 136 134*  K 3.6 3.4*  CL 103 98*  CO2 22 29  GLUCOSE 147* 90  BUN 7 <5*  CREATININE 0.77 0.82  CALCIUM 9.1 8.5*   Liver Function Tests:  Recent Labs Lab 09/08/14 1719 09/10/14 0808  AST 18 21  ALT 10* 12*  ALKPHOS 71 69  BILITOT 1.2 0.7  PROT 7.4 6.9  ALBUMIN 3.9 3.5    Recent Labs Lab 09/08/14 1719 09/10/14 0808  LIPASE 20* 12*  AMYLASE  --  28   No results for input(s): AMMONIA in the last 168 hours. CBC:  Recent Labs Lab 09/08/14 1719 09/10/14 0808  WBC 7.4 8.0  NEUTROABS  --  6.5  HGB 12.8 11.3*  HCT 37.8 33.7*  MCV 92.2 93.4  PLT 224 204   Cardiac Enzymes: No results for input(s): CKTOTAL, CKMB, CKMBINDEX, TROPONINI in the last 168 hours. BNP (last 3 results) No results for input(s): BNP in the last 8760 hours.  ProBNP (last 3 results) No results for input(s): PROBNP in the last 8760 hours.  CBG: No results for input(s): GLUCAP in the last 168 hours.  Recent Results (from the past 240 hour(s))  Wet prep, genital     Status: Abnormal  Collection Time: 09/08/14  6:17 PM  Result Value Ref Range Status   Yeast Wet Prep HPF POC NONE SEEN NONE SEEN Final   Trich, Wet Prep MODERATE (A) NONE SEEN Final   Clue Cells Wet Prep HPF POC NONE SEEN NONE SEEN Final   WBC, Wet Prep HPF POC TOO NUMEROUS TO COUNT (A) NONE SEEN Final     Studies: US Abdomen Limited Ruq  September 23, 2014   CLINICAL DATA:  Right upper quadrant pain .  EXAM: US ABDOMEN LIMITED - RIGHT UPPER QUADRANT  COMPARISON:  None.  FINDINGS: Gallbladder:  No gallstones or wall thickening visualized. No sonographic Murphy sign noted.  Common bile duct:  Diameter: 3 mm  Liver:  No focal lesion  identified. Within normal limits in parenchymal echogenicity.  IMPRESSION: Normal exam.   Electronically Signed   By: Maisie Fus  Register   On: 09/23/14 09:08    Scheduled Meds: . cefOXitin  2 g Intravenous 4 times per day  . doxycycline (VIBRAMYCIN) IV  100 mg Intravenous Q12H  . prenatal multivitamin  1 tablet Oral Q1200   Continuous Infusions: . lactated ringers 100 mL/hr at 09/11/14 0551    Active Problems:   PID (acute pelvic inflammatory disease)   Abdominal pain, RUQ (right upper quadrant)  Time spent: 25 minutes  Brooke Pace  Faculty Practice Attending Physician [facility] Phone: [phone] 09/11/2014, 7:23 AM  LOS: 2 days

## 2014-09-11 NOTE — Progress Notes (Signed)
IV infiltrated at 1800 as dose of mefoxin began infusing. IV removed. Dr. Debroah Loop notified and will put in an order for P.O. Doxycycline. Per MD - will see how pt does without IV access. No orders to start new IV at this time. Will continue to monitor.

## 2014-09-12 DIAGNOSIS — R1011 Right upper quadrant pain: Secondary | ICD-10-CM

## 2014-09-12 DIAGNOSIS — N730 Acute parametritis and pelvic cellulitis: Principal | ICD-10-CM

## 2014-09-12 MED ORDER — OXYCODONE-ACETAMINOPHEN 5-325 MG PO TABS: 1.0000 | ORAL_TABLET | Freq: Four times a day (QID) | ORAL | Status: DC | PRN

## 2014-09-12 MED ORDER — DOXYCYCLINE HYCLATE 100 MG PO TABS: 100.0000 mg | ORAL_TABLET | Freq: Two times a day (BID) | ORAL | Status: DC

## 2014-09-12 NOTE — Discharge Summary (Signed)
Physician Discharge Summary  Patient ID: Sally Allen MRN: [id number] DOB/AGE: September 05, 1995 19 y.o.  Admit date: 09/08/2014 Discharge date: 09/12/2014  Admission Diagnoses:PID  Discharge Diagnoses: same Active Problems:   PID (acute pelvic inflammatory disease)   Abdominal pain, RUQ (right upper quadrant)   Discharged Condition: fair  Hospital Course: Expand All Collapse All    History     CSN: [id number]  Arrival date and time: 09/08/14 1707  None    Chief Complaint  Patient presents with  . Abdominal Pain   Abdominal Pain This is a new problem. Episode onset: 2 days ago. The onset quality is gradual. The problem occurs constantly. The problem has been gradually worsening. The pain is located in the RLQ, right flank and RUQ. The pain is moderate. The quality of the pain is aching. Associated symptoms include dysuria, frequency and nausea (mild). Pertinent negatives include no fever. Nothing aggravates the pain. The pain is relieved by nothing. Prior diagnostic workup includes CT scan and ultrasound.  G1P1001 Patient's last menstrual period was 09/03/2014.   Pertinent Gynecological History: Menses: flow is moderate  Contraception: Nexplanon  Blood transfusions: none Sexually transmitted diseases: recent diagnosis: trichomonas and past history: gonorrhea Previous GYN Procedures: no      Past Medical History  Diagnosis Date  . Anxiety   . Depression     Past Surgical History  Procedure Laterality Date  . No past surgeries      History reviewed. No pertinent family history.  History  Substance Use Topics  . Smoking status: Current Every Day Smoker  . Smokeless tobacco: Not on file  . Alcohol Use: Yes     Comment: sociallly     Allergies:  Allergies  Allergen Reactions  . Adderall [Amphetamine-Dextroamphetamine] Other (See Comments)    Depression     Prescriptions prior to admission   Medication Sig Dispense Refill Last Dose  . promethazine (PHENERGAN) 25 MG tablet Take 1 tablet (25 mg total) by mouth every 6 (six) hours as needed for nausea or vomiting. 12 tablet 1    OB History    Gravida Para Term Preterm AB TAB SAB Ectopic Multiple Living   1         1     G1P1 11 mo ago [place]  Review of Systems  Constitutional: Negative for fever.  Gastrointestinal: Positive for nausea (mild) and abdominal pain.  Genitourinary: Positive for dysuria, urgency, frequency and flank pain.   Vaginal discharge and irritation  Psychiatric/Behavioral: Negative.   Physical Exam   Blood pressure 114/72, pulse 86, temperature 98.7 F (37.1 C), temperature source Oral, resp. rate 18, last menstrual period 09/03/2014, SpO2 100 %.  Physical Exam  Constitutional: She is oriented to person, place, and time. She appears well-developed. She appears distressed (mild discomfort).  Neck: Normal range of motion.  Cardiovascular: Normal rate.  Respiratory: Effort normal. No respiratory distress.  GI: Soft. She exhibits no mass. There is tenderness (RLQ). There is guarding. There is no rebound.  Right CVAT  Genitourinary: Uterus normal. Vaginal discharge found.  No CMT, no mass, inconsistent right side tenderness  Neurological: She is alert and oriented to person, place, and time.  Skin: Skin is warm and dry.  Psychiatric: She has a normal mood and affect. Her behavior is normal.  CLINICAL DATA: Acute onset of right lower quadrant abdominal pain for 2 days. Initial encounter.  EXAM: TRANSABDOMINAL AND TRANSVAGINAL ULTRASOUND OF PELVIS  DOPPLER ULTRASOUND OF OVARIES  TECHNIQUE: Both transabdominal and transvaginal ultrasound examinations  of the pelvis were performed. Transabdominal technique was performed for global imaging of the pelvis including uterus, ovaries, adnexal regions, and pelvic cul-de-sac.  It was necessary to proceed with  endovaginal exam following the transabdominal exam to visualize the endometrium. Color and duplex Doppler ultrasound was utilized to evaluate blood flow to the ovaries.  COMPARISON: None.  FINDINGS: Uterus  Measurements: 7.1 x 5.5 x 5.7 cm. No fibroids or other mass visualized. The uterus is retroflexed in nature.  Endometrium  Thickness: 0.8 cm. No focal abnormality visualized.  Right ovary  Measurements: 2.9 x 1.7 x 2.8 cm. Normal appearance/no adnexal mass.  Left ovary  Measurements: 4.1 x 2.3 x 3.4 cm. Normal appearance/no adnexal mass.  Pulsed Doppler evaluation of both ovaries demonstrates normal low-resistance arterial and venous waveforms.  Other findings  No free fluid is seen within the pelvic cul-de-sac.  IMPRESSION: Unremarkable pelvic ultrasound. No evidence for ovarian torsion.   Electronically Signed  By: Roanna Raider M.D. Pelvic US nl except small pelvic free fluid  Urinalysis  Labs (Brief)       Component Value Date/Time   COLORURINE YELLOW 09/09/2014 0110   APPEARANCEUR CLEAR 09/09/2014 0110   LABSPEC 1.010 09/09/2014 0110   PHURINE 5.5 09/09/2014 0110   GLUCOSEU NEGATIVE 09/09/2014 0110   HGBUR NEGATIVE 09/09/2014 0110   BILIRUBINUR NEGATIVE 09/09/2014 0110   KETONESUR NEGATIVE 09/09/2014 0110   PROTEINUR NEGATIVE 09/09/2014 0110   UROBILINOGEN 0.2 09/09/2014 0110   NITRITE NEGATIVE 09/09/2014 0110   LEUKOCYTESUR NEGATIVE 09/09/2014 0110         MAU Course  Procedures  MDM Moderate, radiology and labs reviewed  Assessment and Plan  Inconsistent findings but history and current resulted suggest possible PID. Treated for trichomonas. Urinalysis negative. Begin mefoxin 2 grams Q6 h and doxycyline 100 mg BID  Damiah Mcdonald 09/09/2014, 12:48 AM      Patient was treated for PID and gonorrhea and chlamydia were positive in addition to her trichomonas. She  remained afebrile and her pain was managed with oral medication at discharge  Consults: None  Significant Diagnostic Studies: labs: CBC , microbiology: CT and GC and radiology: Ultrasound: pelvis and RUQ nl  Treatments: antibiotics: doxycycline and mefoxin and analgesia: Dilaudid and percocet  Discharge Exam: Blood pressure 107/69, pulse 54, temperature 98.5 F (36.9 C), temperature source Oral, resp. rate 16, height  (1.651 m), weight 54.432 kg (120 lb), last menstrual period 09/03/2014, SpO2 100 %. General appearance: alert, cooperative and mild distress GI: mild RUQ tenderness Extremities: extremities normal, atraumatic, no cyanosis or edema  Disposition: 01-Home or Self Care  Discharge Instructions    Discharge patient    Complete by:  As directed   To home            Medication List    STOP taking these medications        promethazine 25 MG tablet  Commonly known as:  PHENERGAN      TAKE these medications        doxycycline 100 MG tablet  Commonly known as:  VIBRA-TABS  Take 1 tablet (100 mg total) by mouth every 12 (twelve) hours.     oxyCODONE-acetaminophen 5-325 MG per tablet  Commonly known as:  PERCOCET/ROXICET  Take 1-2 tablets by mouth every 6 (six) hours as needed (moderate to severe pain (when tolerating fluids)).           Follow-up Information    Follow up with [facility] In 2 weeks.   Specialty:  Obstetrics and Gynecology   Contact information:   [address] [place] [address] [phone]      Signed: Scheryl Darter 09/12/2014, 7:51 AM

## 2014-09-12 NOTE — Progress Notes (Signed)
Pt is discharged in the care of boyfriend,with R.N. Escort. Denies pain or discomfort. Abdominal pain is improving,not as tender. Discharged instructions with Rx were given to pt with good  Understanding. Questions were asked and answered.Stable.Denies vaginal bleeding or discharge.

## 2014-09-12 NOTE — Discharge Instructions (Signed)
Pelvic Inflammatory Disease °Pelvic inflammatory disease (PID) refers to an infection in some or all of the female organs. The infection can be in the uterus, ovaries, fallopian tubes, or the surrounding tissues in the pelvis. PID can cause abdominal or pelvic pain that comes on suddenly (acute pelvic pain). PID is a serious infection because it can lead to lasting (chronic) pelvic pain or the inability to have children (infertile).  °CAUSES  °The infection is often caused by the normal bacteria found in the vaginal tissues. PID may also be caused by an infection that is spread during sexual contact. PID can also occur following:  °· The birth of a baby.   °· A miscarriage.   °· An abortion.   °· Major pelvic surgery.   °· The use of an intrauterine device (IUD).   °· A sexual assault.   °RISK FACTORS °Certain factors can put a person at higher risk for PID, such as: °· Being younger than 25 years. °· Being sexually active at a young age. °· Using nonbarrier contraception. °· Having multiple sexual partners. °· Having sex with someone who has symptoms of a genital infection. °· Using oral contraception. °Other times, certain behaviors can increase the possibility of getting PID, such as: °· Having sex during your period. °· Using a vaginal douche. °· Having an intrauterine device (IUD) in place. °SYMPTOMS  °· Abdominal or pelvic pain.   °· Fever.   °· Chills.   °· Abnormal vaginal discharge. °· Abnormal uterine bleeding.   °· Unusual pain shortly after finishing your period. °DIAGNOSIS  °Your caregiver will choose some of the following methods to make a diagnosis, such as:  °· Performing a physical exam and history. A pelvic exam typically reveals a very tender uterus and surrounding pelvis.   °· Ordering laboratory tests including a pregnancy test, blood tests, and urine test.  °· Ordering cultures of the vagina and cervix to check for a sexually transmitted infection (STI). °· Performing an ultrasound.    °· Performing a laparoscopic procedure to look inside the pelvis.   °TREATMENT  °· Antibiotic medicines may be prescribed and taken by mouth.   °· Sexual partners may be treated when the infection is caused by a sexually transmitted disease (STD).   °· Hospitalization may be needed to give antibiotics intravenously. °· Surgery may be needed, but this is rare. °It may take weeks until you are completely well. If you are diagnosed with PID, you should also be checked for human immunodeficiency virus (HIV).   °HOME CARE INSTRUCTIONS  °· If given, take your antibiotics as directed. Finish the medicine even if you start to feel better.   °· Only take over-the-counter or prescription medicines for pain, discomfort, or fever as directed by your caregiver.   °· Do not have sexual intercourse until treatment is completed or as directed by your caregiver. If PID is confirmed, your recent sexual partner(s) will need treatment.   °· Keep your follow-up appointments. °SEEK MEDICAL CARE IF:  °· You have increased or abnormal vaginal discharge.   °· You need prescription medicine for your pain.   °· You vomit.   °· You cannot take your medicines.   °· Your partner has an STD.   °SEEK IMMEDIATE MEDICAL CARE IF:  °· You have a fever.   °· You have increased abdominal or pelvic pain.   °· You have chills.   °· You have pain when you urinate.   °· You are not better after 72 hours following treatment.   °MAKE SURE YOU:  °· Understand these instructions. °· Will watch your condition. °· Will get help right away if you are not doing well or get worse. °  Document Released: 01/19/2005 Document Revised: 05/16/2012 Document Reviewed: 01/15/2011 °[employer]® Patient Information ©2015 [employer]. This information is not intended to replace advice given to you by your health care provider. Make sure you discuss any questions you have with your health care provider. ° °

## 2014-09-12 NOTE — H&P (Signed)
Expand All Collapse All    History     CSN: [id number]  Arrival date and time: 09/08/14 1707  None    Chief Complaint  Patient presents with  . Abdominal Pain   Abdominal Pain This is a new problem. Episode onset: 2 days ago. The onset quality is gradual. The problem occurs constantly. The problem has been gradually worsening. The pain is located in the RLQ, right flank and RUQ. The pain is moderate. The quality of the pain is aching. Associated symptoms include dysuria, frequency and nausea (mild). Pertinent negatives include no fever. Nothing aggravates the pain. The pain is relieved by nothing. Prior diagnostic workup includes CT scan and ultrasound.  G1P1001 Patient's last menstrual period was 09/03/2014.   Pertinent Gynecological History: Menses: flow is moderate  Contraception: Nexplanon  Blood transfusions: none Sexually transmitted diseases: recent diagnosis: trichomonas and past history: gonorrhea Previous GYN Procedures: no      Past Medical History  Diagnosis Date  . Anxiety   . Depression     Past Surgical History  Procedure Laterality Date  . No past surgeries      History reviewed. No pertinent family history.  History  Substance Use Topics  . Smoking status: Current Every Day Smoker  . Smokeless tobacco: Not on file  . Alcohol Use: Yes     Comment: sociallly     Allergies:  Allergies  Allergen Reactions  . Adderall [Amphetamine-Dextroamphetamine] Other (See Comments)    Depression     Prescriptions prior to admission  Medication Sig Dispense Refill Last Dose  . promethazine (PHENERGAN) 25 MG tablet Take 1 tablet (25 mg total) by mouth every 6 (six) hours as needed for nausea or vomiting. 12 tablet 1    OB History    Gravida Para Term Preterm AB TAB SAB Ectopic Multiple Living   1         1     G1P1 11 mo ago   Review of Systems   Constitutional: Negative for fever.  Gastrointestinal: Positive for nausea (mild) and abdominal pain.  Genitourinary: Positive for dysuria, urgency, frequency and flank pain.   Vaginal discharge and irritation  Psychiatric/Behavioral: Negative.   Physical Exam   Blood pressure 114/72, pulse 86, temperature 98.7 F (37.1 C), temperature source Oral, resp. rate 18, last menstrual period 09/03/2014, SpO2 100 %.  Physical Exam  Constitutional: She is oriented to person, place, and time. She appears well-developed. She appears distressed (mild discomfort).  Neck: Normal range of motion.  Cardiovascular: Normal rate.  Respiratory: Effort normal. No respiratory distress.  GI: Soft. She exhibits no mass. There is tenderness (RLQ). There is guarding. There is no rebound.  Right CVAT  Genitourinary: Uterus normal. Vaginal discharge found.  No CMT, no mass, inconsistent right side tenderness  Neurological: She is alert and oriented to person, place, and time.  Skin: Skin is warm and dry.  Psychiatric: She has a normal mood and affect. Her behavior is normal.  CLINICAL DATA: Acute onset of right lower quadrant abdominal pain for 2 days. Initial encounter.  EXAM: TRANSABDOMINAL AND TRANSVAGINAL ULTRASOUND OF PELVIS  DOPPLER ULTRASOUND OF OVARIES  TECHNIQUE: Both transabdominal and transvaginal ultrasound examinations of the pelvis were performed. Transabdominal technique was performed for global imaging of the pelvis including uterus, ovaries, adnexal regions, and pelvic cul-de-sac.  It was necessary to proceed with endovaginal exam following the transabdominal exam to visualize the endometrium. Color and duplex Doppler ultrasound was utilized to evaluate blood flow to the  ovaries.  COMPARISON: None.  FINDINGS: Uterus  Measurements: 7.1 x 5.5 x 5.7 cm. No fibroids or other mass visualized. The uterus is retroflexed in nature.  Endometrium  Thickness: 0.8  cm. No focal abnormality visualized.  Right ovary  Measurements: 2.9 x 1.7 x 2.8 cm. Normal appearance/no adnexal mass.  Left ovary  Measurements: 4.1 x 2.3 x 3.4 cm. Normal appearance/no adnexal mass.  Pulsed Doppler evaluation of both ovaries demonstrates normal low-resistance arterial and venous waveforms.  Other findings  No free fluid is seen within the pelvic cul-de-sac.  IMPRESSION: Unremarkable pelvic ultrasound. No evidence for ovarian torsion.   Electronically Signed  By: Roanna Raider M.D. Pelvic US nl except small pelvic free fluid  Urinalysis  Labs (Brief)       Component Value Date/Time   COLORURINE YELLOW 09/09/2014 0110   APPEARANCEUR CLEAR 09/09/2014 0110   LABSPEC 1.010 09/09/2014 0110   PHURINE 5.5 09/09/2014 0110   GLUCOSEU NEGATIVE 09/09/2014 0110   HGBUR NEGATIVE 09/09/2014 0110   BILIRUBINUR NEGATIVE 09/09/2014 0110   KETONESUR NEGATIVE 09/09/2014 0110   PROTEINUR NEGATIVE 09/09/2014 0110   UROBILINOGEN 0.2 09/09/2014 0110   NITRITE NEGATIVE 09/09/2014 0110   LEUKOCYTESUR NEGATIVE 09/09/2014 0110         MAU Course  Procedures  MDM Moderate, radiology and labs reviewed  Assessment and Plan  Inconsistent findings but history and current resulted suggest possible PID. Treated for trichomonas. Urinalysis negative. Begin mefoxin 2 grams Q6 h and doxycyline 100 mg BID  Sally Allen 09/09/2014, 12:48 AM

## 2014-09-18 NOTE — Progress Notes (Signed)
Post discharge chart review completed.  

## 2014-11-28 ENCOUNTER — Encounter (HOSPITAL_COMMUNITY): Payer: Self-pay | Admitting: Emergency Medicine

## 2014-11-28 ENCOUNTER — Emergency Department (HOSPITAL_COMMUNITY)
Admission: EM | Admit: 2014-11-28 | Discharge: 2014-11-29 | Disposition: A | Discharge status: Home / Self Care | Payer: BLUE CROSS/BLUE SHIELD | Attending: Emergency Medicine | Admitting: Emergency Medicine

## 2014-11-28 DIAGNOSIS — N739 Female pelvic inflammatory disease, unspecified: Secondary | ICD-10-CM

## 2014-11-28 DIAGNOSIS — Z8659 Personal history of other mental and behavioral disorders: Secondary | ICD-10-CM | POA: Diagnosis not present

## 2014-11-28 DIAGNOSIS — Z3202 Encounter for pregnancy test, result negative: Secondary | ICD-10-CM | POA: Diagnosis not present

## 2014-11-28 DIAGNOSIS — N390 Urinary tract infection, site not specified: Secondary | ICD-10-CM | POA: Diagnosis not present

## 2014-11-28 DIAGNOSIS — Z792 Long term (current) use of antibiotics: Secondary | ICD-10-CM | POA: Diagnosis not present

## 2014-11-28 DIAGNOSIS — N938 Other specified abnormal uterine and vaginal bleeding: Secondary | ICD-10-CM | POA: Diagnosis not present

## 2014-11-28 DIAGNOSIS — Z720 Tobacco use: Secondary | ICD-10-CM | POA: Insufficient documentation

## 2014-11-28 LAB — I-STAT BETA HCG BLOOD, ED (MC, WL, AP ONLY): I-stat hCG, quantitative: <5 m[IU]/mL (ref <5)

## 2014-11-28 LAB — URINE MICROSCOPIC-ADD ON

## 2014-11-28 LAB — CBC
HCT: 34.4 % — ABNORMAL LOW (ref 36.0–46.0)
HEMOGLOBIN: 11.8 g/dL — AB (ref 12.0–15.0)
MCH: 31.9 pg (ref 26.0–34.0)
MCHC: 34.3 g/dL (ref 30.0–36.0)
MCV: 93 fL (ref 78.0–100.0)
PLATELETS: 296 10*3/uL (ref 150–400)
RBC: 3.7 MIL/uL — ABNORMAL LOW (ref 3.87–5.11)
RDW: 13.4 % (ref 11.5–15.5)
WBC: 5.3 10*3/uL (ref 4.0–10.5)

## 2014-11-28 LAB — URINALYSIS, ROUTINE W REFLEX MICROSCOPIC
BILIRUBIN URINE: NEGATIVE
GLUCOSE, UA: NEGATIVE mg/dL
KETONES UR: NEGATIVE mg/dL
Nitrite: POSITIVE — AB
PROTEIN: NEGATIVE mg/dL
Specific Gravity, Urine: 1.022 (ref 1.005–1.030)
UROBILINOGEN UA: 0.2 mg/dL (ref 0.0–1.0)
pH: 6 (ref 5.0–8.0)

## 2014-11-28 NOTE — ED Notes (Signed)
Pt. reports vaginal bleeding for 2 months with generalized abdominal pain " burning " , denies emesis or diarrhea .

## 2014-11-28 NOTE — ED Provider Notes (Signed)
CSN: [id number]     Arrival date & time 11/28/14  2241 History  By signing my name below, I, Doreatha Martin, attest that this documentation has been prepared under the direction and in the presence of Dione Booze, MD. Electronically Signed: Doreatha Martin, ED Scribe. 11/28/2014. 11:53 PM.    Chief Complaint  Patient presents with  . Vaginal Bleeding   The history is provided by the patient. No language interpreter was used.    HPI Comments: Sally Allen is a 19 y.o. female who presents to the Emergency Department complaining of moderate, gradually worsening vaginal bleeding onset 2 months ago with associated nausea, lower abdominal pain that was increased from baseline tonight. Pt states that she has been using 25 pads a day for the last week. No clots noted. LNMP was 3 months ago. She reports that her nausea is worsened with eating. Pt has had an Implanon for 6 months. She notes that she also uses condoms. Pt has had one pregnancy and one live birth. No miscarriages or abortions. Pt is not followed by a gynecologist and got her implant at the [facility]. She denies current chills, fever, dizziness, lightheadedness.   Past Medical History  Diagnosis Date  . Anxiety   . Depression    Past Surgical History  Procedure Laterality Date  . No past surgeries     No family history on file. Social History  Substance Use Topics  . Smoking status: Current Every Day Smoker  . Smokeless tobacco: None  . Alcohol Use: Yes     Comment: sociallly    OB History    Gravida Para Term Preterm AB TAB SAB Ectopic Multiple Living   1         1     Review of Systems  Constitutional: Negative for fever and chills.  Gastrointestinal: Positive for abdominal pain.  Genitourinary: Positive for vaginal bleeding.  Neurological: Negative for dizziness and light-headedness.  All other systems reviewed and are negative.  Allergies  Adderall  Home Medications   Prior to Admission  medications   Medication Sig Start Date End Date Taking? Authorizing Provider  doxycycline (VIBRA-TABS) 100 MG tablet Take 1 tablet (100 mg total) by mouth every 12 (twelve) hours. 09/12/14   Adam Phenix, MD  oxyCODONE-acetaminophen (PERCOCET/ROXICET) 5-325 MG per tablet Take 1-2 tablets by mouth every 6 (six) hours as needed (moderate to severe pain (when tolerating fluids)). 09/12/14   Adam Phenix, MD   BP 111/72 mmHg  Pulse 61  Temp(Src) 98.2 F (36.8 C) (Oral)  Resp 16  SpO2 100% Physical Exam  Constitutional: She is oriented to person, place, and time. She appears well-developed and well-nourished.  HENT:  Head: Normocephalic and atraumatic.  Eyes: Conjunctivae and EOM are normal. Pupils are equal, round, and reactive to light.  Neck: Normal range of motion. Neck supple. No JVD present.  Cardiovascular: Normal rate, regular rhythm and normal heart sounds.   No murmur heard. Pulmonary/Chest: Effort normal and breath sounds normal. She has no wheezes. She has no rales. She exhibits no tenderness.  Abdominal: Soft. Bowel sounds are normal. She exhibits no distension and no mass. There is no tenderness.  Genitourinary:  Normal external genitalia. Cervix is closed. Small amount of blood present in the vaginal vault. Mild cervical motion tenderness. Fundus normal size and position. Mild bilateral adnexal tenderness without any adnexal masses.  Musculoskeletal: Normal range of motion. She exhibits no edema.  Lymphadenopathy:    She has no  cervical adenopathy.  Neurological: She is alert and oriented to person, place, and time.  Skin: Skin is warm and dry. No rash noted.  Psychiatric: She has a normal mood and affect. Her behavior is normal. Judgment and thought content normal.  Nursing note and vitals reviewed.  ED Course  Procedures (including critical care time) DIAGNOSTIC STUDIES: Oxygen Saturation is 100% on RA, normal by my interpretation.    COORDINATION OF CARE: 11:50 PM   Discussed treatment plan with pt at bedside and pt agreed to plan.   Labs Review Results for orders placed or performed during the hospital encounter of 11/28/14  Wet prep, genital  Result Value Ref Range   Yeast Wet Prep HPF POC NONE SEEN NONE SEEN   Trich, Wet Prep NONE SEEN NONE SEEN   Clue Cells Wet Prep HPF POC FEW (A) NONE SEEN   WBC, Wet Prep HPF POC NONE SEEN NONE SEEN  Comprehensive metabolic panel  Result Value Ref Range   Sodium 135 135 - 145 mmol/L   Potassium 3.9 3.5 - 5.1 mmol/L   Chloride 103 101 - 111 mmol/L   CO2 27 22 - 32 mmol/L   Glucose, Bld 96 65 - 99 mg/dL   BUN 8 6 - 20 mg/dL   Creatinine, Ser 1.610.71 0.44 - 1.00 mg/dL   Calcium 8.9 8.9 - 09.610.3 mg/dL   Total Protein 6.3 (L) 6.5 - 8.1 g/dL   Albumin 3.7 3.5 - 5.0 g/dL   AST 15 15 - 41 U/L   ALT 11 (L) 14 - 54 U/L   Alkaline Phosphatase 58 38 - 126 U/L   Total Bilirubin 0.4 0.3 - 1.2 mg/dL   GFR calc non Af Amer >60 >60 mL/min   GFR calc Af Amer >60 >60 mL/min   Anion gap 5 5 - 15  CBC  Result Value Ref Range   WBC 5.3 4.0 - 10.5 K/uL   RBC 3.70 (L) 3.87 - 5.11 MIL/uL   Hemoglobin 11.8 (L) 12.0 - 15.0 g/dL   HCT 04.534.4 (L) 40.936.0 - 81.146.0 %   MCV 93.0 78.0 - 100.0 fL   MCH 31.9 26.0 - 34.0 pg   MCHC 34.3 30.0 - 36.0 g/dL   RDW 91.413.4 78.211.5 - 95.615.5 %   Platelets 296 150 - 400 K/uL  Urinalysis, Routine w reflex microscopic (not [facility])  Result Value Ref Range   Color, Urine YELLOW YELLOW   APPearance CLOUDY (A) CLEAR   Specific Gravity, Urine 1.022 1.005 - 1.030   pH 6.0 5.0 - 8.0   Glucose, UA NEGATIVE NEGATIVE mg/dL   Hgb urine dipstick LARGE (A) NEGATIVE   Bilirubin Urine NEGATIVE NEGATIVE   Ketones, ur NEGATIVE NEGATIVE mg/dL   Protein, ur NEGATIVE NEGATIVE mg/dL   Urobilinogen, UA 0.2 0.0 - 1.0 mg/dL   Nitrite POSITIVE (A) NEGATIVE   Leukocytes, UA SMALL (A) NEGATIVE  Urine microscopic-add on  Result Value Ref Range   Squamous Epithelial / LPF FEW (A) RARE   WBC, UA 7-10 <3 WBC/hpf   RBC / HPF  7-10 <3 RBC/hpf   Bacteria, UA MANY (A) RARE  I-Stat beta hCG blood, ED ([facility], [facility], [facility] only)  Result Value Ref Range   I-stat hCG, quantitative <5.0 <5 mIU/mL   Comment 3           I have personally reviewed and evaluated these lab results as part of my medical decision-making.  MDM   Final diagnoses:  Dysfunctional uterine bleeding  Urinary  tract infection without hematuria, site unspecified  Pelvic inflammatory disease    Abnormal vaginal bleeding. Patient has an implanted birth control device which presumably is long-acting progesterone. Exam is concerning for possible pelvic inflammatory disease. Urinalysis also shows evidence of urinary tract infection. She will be treated for pelvic inflammatory disease which also should provide adequate treatment for urinary tract infection and she will be referred to GYN for follow-up. She'll also be placed in a short course of medroxyprogesterone.   I personally performed the services described in this documentation, which was scribed in my presence. The recorded information has been reviewed and is accurate.      Dione Booze, MD 11/29/14 [phone]

## 2014-11-29 LAB — COMPREHENSIVE METABOLIC PANEL
ALBUMIN: 3.7 g/dL (ref 3.5–5.0)
ALK PHOS: 58 U/L (ref 38–126)
ALT: 11 U/L — AB (ref 14–54)
ANION GAP: 5 (ref 5–15)
AST: 15 U/L (ref 15–41)
BILIRUBIN TOTAL: 0.4 mg/dL (ref 0.3–1.2)
BUN: 8 mg/dL (ref 6–20)
CALCIUM: 8.9 mg/dL (ref 8.9–10.3)
CO2: 27 mmol/L (ref 22–32)
CREATININE: 0.71 mg/dL (ref 0.44–1.00)
Chloride: 103 mmol/L (ref 101–111)
GFR calc Af Amer: >60 mL/min (ref >60)
GFR calc non Af Amer: >60 mL/min (ref >60)
GLUCOSE: 96 mg/dL (ref 65–99)
Potassium: 3.9 mmol/L (ref 3.5–5.1)
Sodium: 135 mmol/L (ref 135–145)
TOTAL PROTEIN: 6.3 g/dL — AB (ref 6.5–8.1)

## 2014-11-29 LAB — GC/CHLAMYDIA PROBE AMP (~~LOC~~) NOT AT ARMC
Chlamydia: NEGATIVE
Neisseria Gonorrhea: NEGATIVE

## 2014-11-29 LAB — HIV ANTIBODY (ROUTINE TESTING W REFLEX): HIV SCREEN 4TH GENERATION: NONREACTIVE

## 2014-11-29 LAB — WET PREP, GENITAL
TRICH WET PREP: NONE SEEN
WBC, Wet Prep HPF POC: NONE SEEN
YEAST WET PREP: NONE SEEN

## 2014-11-29 LAB — RPR: RPR Ser Ql: NONREACTIVE

## 2014-11-29 MED ORDER — CEFTRIAXONE SODIUM 250 MG IJ SOLR
250.0000 mg | Freq: Once | INTRAMUSCULAR | Status: AC
  Administered 2014-11-29: 250 mg via INTRAMUSCULAR
  Filled 2014-11-29: qty 250

## 2014-11-29 MED ORDER — MEDROXYPROGESTERONE ACETATE 10 MG PO TABS
10.0000 mg | ORAL_TABLET | Freq: Every day | ORAL | Status: DC
  Administered 2014-11-29: 10 mg via ORAL
  Filled 2014-11-29: qty 1

## 2014-11-29 MED ORDER — MEDROXYPROGESTERONE ACETATE 5 MG PO TABS: 5.0000 mg | ORAL_TABLET | Freq: Every day | ORAL | Status: DC

## 2014-11-29 MED ORDER — ONDANSETRON HCL 4 MG PO TABS: 4.0000 mg | ORAL_TABLET | Freq: Four times a day (QID) | ORAL | Status: DC | PRN

## 2014-11-29 MED ORDER — DOXYCYCLINE HYCLATE 100 MG PO CAPS: 100.0000 mg | ORAL_CAPSULE | Freq: Two times a day (BID) | ORAL | Status: DC

## 2014-11-29 MED ORDER — AZITHROMYCIN 1 G PO PACK
1.0000 g | PACK | Freq: Once | ORAL | Status: AC
  Administered 2014-11-29: 1 g via ORAL
  Filled 2014-11-29: qty 1

## 2014-11-29 MED ORDER — DOXYCYCLINE HYCLATE 100 MG PO TABS
100.0000 mg | ORAL_TABLET | Freq: Once | ORAL | Status: AC
  Administered 2014-11-29: 100 mg via ORAL
  Filled 2014-11-29: qty 1

## 2014-11-29 NOTE — Discharge Instructions (Signed)
Abnormal Uterine Bleeding °Abnormal uterine bleeding can affect women at various stages in life, including teenagers, women in their reproductive years, pregnant women, and women who have reached menopause. Several kinds of uterine bleeding are considered abnormal, including: °· Bleeding or spotting between periods.   °· Bleeding after sexual intercourse.   °· Bleeding that is heavier or more than normal.   °· Periods that last longer than usual. °· Bleeding after menopause.   °Many cases of abnormal uterine bleeding are minor and simple to treat, while others are more serious. Any type of abnormal bleeding should be evaluated by your health care provider. Treatment will depend on the cause of the bleeding. °HOME CARE INSTRUCTIONS °Monitor your condition for any changes. The following actions may help to alleviate any discomfort you are experiencing: °· Avoid the use of tampons and douches as directed by your health care provider. °· Change your pads frequently. °You should get regular pelvic exams and Pap tests. Keep all follow-up appointments for diagnostic tests as directed by your health care provider.  °SEEK MEDICAL CARE IF:  °· Your bleeding lasts more than 1 week.   °· You feel dizzy at times.   °SEEK IMMEDIATE MEDICAL CARE IF:  °· You pass out.   °· You are changing pads every 15 to 30 minutes.   °· You have abdominal pain. °· You have a fever.   °· You become sweaty or weak.   °· You are passing large blood clots from the vagina.   °· You start to feel nauseous and vomit. °MAKE SURE YOU:  °· Understand these instructions. °· Will watch your condition. °· Will get help right away if you are not doing well or get worse. °  °This information is not intended to replace advice given to you by your health care provider. Make sure you discuss any questions you have with your health care provider. °  °Document Released: 01/19/2005 Document Revised: 01/24/2013 Document Reviewed: 08/18/2012 °Elsevier Interactive  Patient Education ©2016 [employer] Inc. ° °Urinary Tract Infection °Urinary tract infections (UTIs) can develop anywhere along your urinary tract. Your urinary tract is your body's drainage system for removing wastes and extra water. Your urinary tract includes two kidneys, two ureters, a bladder, and a urethra. Your kidneys are a pair of bean-shaped organs. Each kidney is about the size of your fist. They are located below your ribs, one on each side of your spine. °CAUSES °Infections are caused by microbes, which are microscopic organisms, including fungi, viruses, and bacteria. These organisms are so small that they can only be seen through a microscope. Bacteria are the microbes that most commonly cause UTIs. °SYMPTOMS  °Symptoms of UTIs may vary by age and gender of the patient and by the location of the infection. Symptoms in young women typically include a frequent and intense urge to urinate and a painful, burning feeling in the bladder or urethra during urination. Older women and men are more likely to be tired, shaky, and weak and have muscle aches and abdominal pain. A fever may mean the infection is in your kidneys. Other symptoms of a kidney infection include pain in your back or sides below the ribs, nausea, and vomiting. °DIAGNOSIS °To diagnose a UTI, your caregiver will ask you about your symptoms. Your caregiver will also ask you to provide a urine sample. The urine sample will be tested for bacteria and white blood cells. White blood cells are made by your body to help fight infection. °TREATMENT  °Typically, UTIs can be treated with medication. Because most UTIs   are caused by a bacterial infection, they usually can be treated with the use of antibiotics. The choice of antibiotic and length of treatment depend on your symptoms and the type of bacteria causing your infection. HOME CARE INSTRUCTIONS  If you were prescribed antibiotics, take them exactly as your caregiver instructs you. Finish the  medication even if you feel better after you have only taken some of the medication.  Drink enough water and fluids to keep your urine clear or pale yellow.  Avoid caffeine, tea, and carbonated beverages. They tend to irritate your bladder.  Empty your bladder often. Avoid holding urine for long periods of time.  Empty your bladder before and after sexual intercourse.  After a bowel movement, women should cleanse from front to back. Use each tissue only once. SEEK MEDICAL CARE IF:   You have back pain.  You develop a fever.  Your symptoms do not begin to resolve within 3 days. SEEK IMMEDIATE MEDICAL CARE IF:   You have severe back pain or lower abdominal pain.  You develop chills.  You have nausea or vomiting.  You have continued burning or discomfort with urination. MAKE SURE YOU:   Understand these instructions.  Will watch your condition.  Will get help right away if you are not doing well or get worse.   This information is not intended to replace advice given to you by your health care provider. Make sure you discuss any questions you have with your health care provider.   Document Released: 10/29/2004 Document Revised: 10/10/2014 Document Reviewed: 02/27/2011 Elsevier Interactive Patient Education 2016 [employer] Inc.  Pelvic Inflammatory Disease Pelvic inflammatory disease (PID) refers to an infection in some or all of the female organs. The infection can be in the uterus, ovaries, fallopian tubes, or the surrounding tissues in the pelvis. PID can cause abdominal or pelvic pain that comes on suddenly (acute pelvic pain). PID is a serious infection because it can lead to lasting (chronic) pelvic pain or the inability to have children (infertility). CAUSES This condition is most often caused by an infection that is spread during sexual contact. However, the infection can also be caused by the normal bacteria that are found in the vaginal tissues if these bacteria travel  upward into the reproductive organs. PID can also occur following:  The birth of a baby.  A miscarriage.  An abortion.  Major pelvic surgery.  The use of an intrauterine device (IUD).  A sexual assault. RISK FACTORS This condition is more likely to develop in women who:  Are younger than 21 years of age.  Are sexually active at [facility] age.  Use nonbarrier contraception.  Have multiple sexual partners.  Have sex with someone who has symptoms of an STD (sexually transmitted disease).  Use oral contraception. At times, certain behaviors can also increase the possibility of getting PID, such as:  Using a vaginal douche.  Having an IUD in place. SYMPTOMS Symptoms of this condition include:  Abdominal or pelvic pain.  Fever.  Chills.  Abnormal vaginal discharge.  Abnormal uterine bleeding.  Unusual pain shortly after the end of a menstrual period.  Painful urination.  Pain with sexual intercourse.  Nausea and vomiting. DIAGNOSIS To diagnose this condition, your health care provider will do a physical exam and take your medical history. A pelvic exam typically reveals great tenderness in the uterus and the surrounding pelvic tissues. You may also have tests, such as:  Lab tests, including a pregnancy test, blood tests, and  urine test.  Culture tests of the vagina and cervix to check for an STD.  Ultrasound.  A laparoscopic procedure to look inside the pelvis.  Examining vaginal secretions under a microscope. TREATMENT Treatment for this condition may involve one or more approaches.  Antibiotic medicines may be prescribed to be taken by mouth.  Sexual partners may need to be treated if the infection is caused by an STD.  For more severe cases, hospitalization may be needed to give antibiotics directly into a vein through an IV tube.  Surgery may be needed if other treatments do not help, but this is rare. It may take weeks until you are completely  well. If you are diagnosed with PID, you should also be checked for human immunodeficiency virus (HIV). Your health care provider may test you for infection again 3 months after treatment. You should not have unprotected sex. HOME CARE INSTRUCTIONS  Take over-the-counter and prescription medicines only as told by your health care provider.  If you were prescribed an antibiotic medicine, take it as told by your health care provider. Do not stop taking the antibiotic even if you start to feel better.  Do not have sexual intercourse until treatment is completed or as told by your health care provider. If PID is confirmed, your recent sexual partners will need treatment, especially if you had unprotected sex.  Keep all follow-up visits as told by your health care provider. This is important. SEEK MEDICAL CARE IF:  You have increased or abnormal vaginal discharge.  Your pain does not improve.  You vomit.  You have a fever.  You cannot tolerate your medicines.  Your partner has an STD.  You have pain when you urinate. SEEK IMMEDIATE MEDICAL CARE IF:  You have increased abdominal or pelvic pain.  You have chills.  Your symptoms are not better in 72 hours even with treatment.   This information is not intended to replace advice given to you by your health care provider. Make sure you discuss any questions you have with your health care provider.   Document Released: 01/19/2005 Document Revised: 10/10/2014 Document Reviewed: 02/26/2014 Elsevier Interactive Patient Education 2016 [employer] Inc.  Doxycycline tablets or capsules What is this medicine? DOXYCYCLINE (dox i SYE kleen) is a tetracycline antibiotic. It kills certain bacteria or stops their growth. It is used to treat many kinds of infections, like dental, skin, respiratory, and urinary tract infections. It also treats acne, Lyme disease, malaria, and certain sexually transmitted infections. This medicine may be used for other  purposes; ask your health care provider or pharmacist if you have questions. What should I tell my health care provider before I take this medicine? They need to know if you have any of these conditions: -liver disease -long exposure to sunlight like working outdoors -stomach problems like colitis -an unusual or allergic reaction to doxycycline, tetracycline antibiotics, other medicines, foods, dyes, or preservatives -pregnant or trying to get pregnant -breast-feeding How should I use this medicine? Take this medicine by mouth with a full glass of water. Follow the directions on the prescription label. It is best to take this medicine without food, but if it upsets your stomach take it with food. Take your medicine at regular intervals. Do not take your medicine more often than directed. Take all of your medicine as directed even if you think you are better. Do not skip doses or stop your medicine early. Talk to your pediatrician regarding the use of this medicine in children. While  this drug may be prescribed for selected conditions, precautions do apply. Overdosage: If you think you have taken too much of this medicine contact a poison control center or emergency room at once. NOTE: This medicine is only for you. Do not share this medicine with others. What if I miss a dose? If you miss a dose, take it as soon as you can. If it is almost time for your next dose, take only that dose. Do not take double or extra doses. What may interact with this medicine? -antacids -barbiturates -birth control pills -bismuth subsalicylate -carbamazepine -methoxyflurane -other antibiotics -phenytoin -vitamins that contain iron -warfarin This list may not describe all possible interactions. Give your health care provider a list of all the medicines, herbs, non-prescription drugs, or dietary supplements you use. Also tell them if you smoke, drink alcohol, or use illegal drugs. Some items may interact with  your medicine. What should I watch for while using this medicine? Tell your doctor or health care professional if your symptoms do not improve. Do not treat diarrhea with over the counter products. Contact your doctor if you have diarrhea that lasts more than 2 days or if it is severe and watery. Do not take this medicine just before going to bed. It may not dissolve properly when you lay down and can cause pain in your throat. Drink plenty of fluids while taking this medicine to also help reduce irritation in your throat. This medicine can make you more sensitive to the sun. Keep out of the sun. If you cannot avoid being in the sun, wear protective clothing and use sunscreen. Do not use sun lamps or tanning beds/booths. Birth control pills may not work properly while you are taking this medicine. Talk to your doctor about using an extra method of birth control. If you are being treated for a sexually transmitted infection, avoid sexual contact until you have finished your treatment. Your sexual partner may also need treatment. Avoid antacids, aluminum, calcium, magnesium, and iron products for 4 hours before and 2 hours after taking a dose of this medicine. If you are using this medicine to prevent malaria, you should still protect yourself from contact with mosquitos. Stay in screened-in areas, use mosquito nets, keep your body covered, and use an insect repellent. What side effects may I notice from receiving this medicine? Side effects that you should report to your doctor or health care professional as soon as possible: -allergic reactions like skin rash, itching or hives, swelling of the face, lips, or tongue -difficulty breathing -fever -itching in the rectal or genital area -pain on swallowing -redness, blistering, peeling or loosening of the skin, including inside the mouth -severe stomach pain or cramps -unusual bleeding or bruising -unusually weak or tired -yellowing of the eyes or  skin Side effects that usually do not require medical attention (report to your doctor or health care professional if they continue or are bothersome): -diarrhea -loss of appetite -nausea, vomiting This list may not describe all possible side effects. Call your doctor for medical advice about side effects. You may report side effects to FDA at 1-800-FDA-1088. Where should I keep my medicine? Keep out of the reach of children. Store at room temperature, below 30 degrees C (86 degrees F). Protect from light. Keep container tightly closed. Throw away any unused medicine after the expiration date. Taking this medicine after the expiration date can make you seriously ill. NOTE: This sheet is a summary. It may not cover all possible information.  If you have questions about this medicine, talk to your doctor, pharmacist, or health care provider.    2016, Elsevier/[employer] Standard. (2014-05-11 12:10:28)  Medroxyprogesterone tablets What is this medicine? MEDROXYPROGESTERONE (me DROX ee proe JES te rone) is a hormone in a class called progestins. It is commonly used to prevent the uterine lining from overgrowth in women taking an estrogen after menopause. It is also used to treat irregular menstrual bleeding or a lack of menstrual bleeding in women. This medicine may be used for other purposes; ask your health care provider or pharmacist if you have questions. What should I tell my health care provider before I take this medicine? They need to know if you have any of these conditions: -blood vessel disease or a history of a blood clot in the lungs or legs -breast, cervical or vaginal cancer -heart disease -kidney disease -liver disease -migraine -recent miscarriage or abortion -mental depression -migraine -seizures (convulsions) -stroke -vaginal bleeding that has not been evaluated -an unusual or allergic reaction to medroxyprogesterone, other medicines, foods, dyes, or preservatives -pregnant  or trying to get pregnant -breast-feeding How should I use this medicine? Take this medicine by mouth with a glass of water. Follow the directions on the prescription label. Take your doses at regular intervals. Do not take your medicine more often than directed. Talk to your pediatrician regarding the use of this medicine in children. Special care may be needed. While this drug may be prescribed for children as young as 13 years for selected conditions, precautions do apply. Overdosage: If you think you have taken too much of this medicine contact a poison control center or emergency room at once. NOTE: This medicine is only for you. Do not share this medicine with others. What if I miss a dose? If you miss a dose, take it as soon as you can. If it is almost time for your next dose, take only that dose. Do not take double or extra doses. What may interact with this medicine? -barbiturate medicines for inducing sleep or treating seizures (convulsions) -bosentan -carbamazepine -phenytoin -rifampin -St. John's Wort This list may not describe all possible interactions. Give your health care provider a list of all the medicines, herbs, non-prescription drugs, or dietary supplements you use. Also tell them if you smoke, drink alcohol, or use illegal drugs. Some items may interact with your medicine. What should I watch for while using this medicine? Visit your health care professional for regular checks on your progress. You will need a regular breast and pelvic exam. If you have any reason to think you are pregnant, stop taking this medicine at once and contact your doctor or health care professional. What side effects may I notice from receiving this medicine? Side effects that you should report to your doctor or health care professional as soon as possible: -breast tenderness or discharge -changes in mood or emotions, such as depression -changes in vision or speech -pain in the abdomen, chest,  groin, or leg -severe headache -skin rash, itching, or hives -sudden shortness of breath -unusually weak or tired -yellowing of skin or eyes Side effects that usually do not require medical attention (report to your doctor or health care professional if they continue or are bothersome): -acne -change in menstrual bleeding pattern or flow -changes in sexual desire -facial hair growth -fluid retention and swelling -headache -upset stomach -weight gain or loss This list may not describe all possible side effects. Call your doctor for medical advice about side effects. You  may report side effects to FDA at 1-800-FDA-1088. Where should I keep my medicine? Keep out of the reach of children. Store at room temperature between 20 and 25 degrees C (68 and 77 degrees F). Throw away any unused medicine after the expiration date. NOTE: This sheet is a summary. It may not cover all possible information. If you have questions about this medicine, talk to your doctor, pharmacist, or health care provider.    2016, Elsevier/[employer] Standard. (2008-01-19 11:26:12)  Ondansetron tablets What is this medicine? ONDANSETRON (on DAN se tron) is used to treat nausea and vomiting caused by chemotherapy. It is also used to prevent or treat nausea and vomiting after surgery. This medicine may be used for other purposes; ask your health care provider or pharmacist if you have questions. What should I tell my health care provider before I take this medicine? They need to know if you have any of these conditions: -heart disease -history of irregular heartbeat -liver disease -low levels of magnesium or potassium in the blood -an unusual or allergic reaction to ondansetron, granisetron, other medicines, foods, dyes, or preservatives -pregnant or trying to get pregnant -breast-feeding How should I use this medicine? Take this medicine by mouth with a glass of water. Follow the directions on your prescription label.  Take your doses at regular intervals. Do not take your medicine more often than directed. Talk to your pediatrician regarding the use of this medicine in children. Special care may be needed. Overdosage: If you think you have taken too much of this medicine contact a poison control center or emergency room at once. NOTE: This medicine is only for you. Do not share this medicine with others. What if I miss a dose? If you miss a dose, take it as soon as you can. If it is almost time for your next dose, take only that dose. Do not take double or extra doses. What may interact with this medicine? Do not take this medicine with any of the following medications: -apomorphine -certain medicines for fungal infections like fluconazole, itraconazole, ketoconazole, posaconazole, voriconazole -cisapride -dofetilide -dronedarone -pimozide -thioridazine -ziprasidone This medicine may also interact with the following medications: -carbamazepine -certain medicines for depression, anxiety, or psychotic disturbances -fentanyl -linezolid -MAOIs like Carbex, Eldepryl, Marplan, Nardil, and Parnate -methylene blue (injected into a vein) -other medicines that prolong the QT interval (cause an abnormal heart rhythm) -phenytoin -rifampicin -tramadol This list may not describe all possible interactions. Give your health care provider a list of all the medicines, herbs, non-prescription drugs, or dietary supplements you use. Also tell them if you smoke, drink alcohol, or use illegal drugs. Some items may interact with your medicine. What should I watch for while using this medicine? Check with your doctor or health care professional right away if you have any sign of an allergic reaction. What side effects may I notice from receiving this medicine? Side effects that you should report to your doctor or health care professional as soon as possible: -allergic reactions like skin rash, itching or hives, swelling of  the face, lips or tongue -breathing problems -confusion -dizziness -fast or irregular heartbeat -feeling faint or lightheaded, falls -fever and chills -loss of balance or coordination -seizures -sweating -swelling of the hands or feet -tightness in the chest -tremors -unusually weak or tired Side effects that usually do not require medical attention (report to your doctor or health care professional if they continue or are bothersome): -constipation or diarrhea -headache This list may not describe all  possible side effects. Call your doctor for medical advice about side effects. You may report side effects to FDA at 1-800-FDA-1088. Where should I keep my medicine? Keep out of the reach of children. Store between 2 and 30 degrees C (36 and 86 degrees F). Throw away any unused medicine after the expiration date. NOTE: This sheet is a summary. It may not cover all possible information. If you have questions about this medicine, talk to your doctor, pharmacist, or health care provider.    2016, Elsevier/[employer] Standard. (2012-10-26 16:27:45)

## 2014-12-01 LAB — URINE CULTURE: Culture: >=100000

## 2014-12-02 ENCOUNTER — Telehealth (HOSPITAL_BASED_OUTPATIENT_CLINIC_OR_DEPARTMENT_OTHER): Payer: Self-pay | Admitting: Emergency Medicine

## 2014-12-02 NOTE — Progress Notes (Signed)
ED Antimicrobial Stewardship Positive Culture Follow Up   Sally Allen is an 19 y.o. female who [facility] with a chief complaint of  Chief Complaint  Patient presents with  . Vaginal Bleeding    Recent Results (from the past 720 hour(s))  Urine culture     Status: None   Collection Time: 11/28/14 11:27 PM  Result Value Ref Range Status   Specimen Description URINE, RANDOM  Final   Special Requests NONE  Final   Culture   Final    >=100,000 COLONIES/mL ESCHERICHIA COLI Confirmed Extended Spectrum Beta-Lactamase Producer (ESBL)    Report Status 12/01/2014 FINAL  Final   Organism ID, Bacteria ESCHERICHIA COLI  Final      Susceptibility   Escherichia coli - MIC*    AMPICILLIN >=32 RESISTANT Resistant     CEFAZOLIN >=64 RESISTANT Resistant     CEFTRIAXONE >=64 RESISTANT Resistant     CIPROFLOXACIN >=4 RESISTANT Resistant     GENTAMICIN <=1 SENSITIVE Sensitive     IMIPENEM <=0.25 SENSITIVE Sensitive     NITROFURANTOIN <=16 SENSITIVE Sensitive     TRIMETH/SULFA >=320 RESISTANT Resistant     AMPICILLIN/SULBACTAM 4 SENSITIVE Sensitive     PIP/TAZO <=4 SENSITIVE Sensitive     * >=100,000 COLONIES/mL ESCHERICHIA COLI  Wet prep, genital     Status: Abnormal   Collection Time: 11/29/14 12:30 AM  Result Value Ref Range Status   Yeast Wet Prep HPF POC NONE SEEN NONE SEEN Final   Trich, Wet Prep NONE SEEN NONE SEEN Final   Clue Cells Wet Prep HPF POC FEW (A) NONE SEEN Final   WBC, Wet Prep HPF POC NONE SEEN NONE SEEN Final    [x]  Treated with doxycycline, organism resistant to prescribed antimicrobial []  Patient discharged originally without antimicrobial agent and treatment is now indicated  New antibiotic prescription: Macrobid 100 mg BID x 1 week (also continue doxy for PID)  ED Provider: Trixie DredgeEmily West, PA-C   Bertram MillardMichael A Jostin Rue 12/02/2014, 8:57 AM Infectious Diseases Pharmacist Phone# [phone]

## 2014-12-02 NOTE — Telephone Encounter (Signed)
Post ED Visit - Positive Culture Follow-up: Successful Patient Follow-Up  Culture assessed and recommendations reviewed by: []  Celedonio MiyamotoJeremy Frens, Pharm.D., BCPS-AQ ID []  Georgina PillionElizabeth Martin, Pharm.D., BCPS []  HazenMinh Pham, [address]Ph[address]D., BCPS, AAHIVP []  Estella HuskMichelle Turner, Pharm.D., BCPS, AAHIVP []  Cristy Reyes, [address]Pharm[address] []  Tennis Mustassie Stewart, Pharm.D. Garvin FilaMike Maccia PharmD  Positive urine culture E. Coli  [x]  Patient discharged without antimicrobial prescription and treatment is now indicated []  Organism is resistant to prescribed ED discharge antimicrobial []  Patient with positive blood cultures  Changes discussed with ED provider: Trixie DredgeEmily West PA New antibiotic prescription start macrobid 100mg  po bid x 1 week, continue doxycycline for PID [facility] [address]  Contacted patient, 12/02/14 1507   Berle MullMiller, Klea Nall 12/02/2014, 3:05 PM

## 2014-12-21 ENCOUNTER — Emergency Department (HOSPITAL_COMMUNITY)
Admission: EM | Admit: 2014-12-21 | Discharge: 2014-12-21 | Disposition: A | Discharge status: Home / Self Care | Payer: BLUE CROSS/BLUE SHIELD | Attending: Emergency Medicine | Admitting: Emergency Medicine

## 2014-12-21 ENCOUNTER — Encounter (HOSPITAL_COMMUNITY): Payer: Self-pay | Admitting: *Deleted

## 2014-12-21 DIAGNOSIS — N939 Abnormal uterine and vaginal bleeding, unspecified: Secondary | ICD-10-CM

## 2014-12-21 DIAGNOSIS — Z3202 Encounter for pregnancy test, result negative: Secondary | ICD-10-CM | POA: Insufficient documentation

## 2014-12-21 DIAGNOSIS — F17210 Nicotine dependence, cigarettes, uncomplicated: Secondary | ICD-10-CM | POA: Diagnosis not present

## 2014-12-21 DIAGNOSIS — Z8659 Personal history of other mental and behavioral disorders: Secondary | ICD-10-CM | POA: Insufficient documentation

## 2014-12-21 LAB — CBC
HEMATOCRIT: 38.6 % (ref 36.0–46.0)
HEMOGLOBIN: 12.9 g/dL (ref 12.0–15.0)
MCH: 31.4 pg (ref 26.0–34.0)
MCHC: 33.4 g/dL (ref 30.0–36.0)
MCV: 93.9 fL (ref 78.0–100.0)
Platelets: 262 10*3/uL (ref 150–400)
RBC: 4.11 MIL/uL (ref 3.87–5.11)
RDW: 13.3 % (ref 11.5–15.5)
WBC: 4 10*3/uL (ref 4.0–10.5)

## 2014-12-21 LAB — URINALYSIS, ROUTINE W REFLEX MICROSCOPIC
Glucose, UA: NEGATIVE mg/dL
Ketones, ur: NEGATIVE mg/dL
NITRITE: NEGATIVE
Protein, ur: 30 mg/dL — AB
SPECIFIC GRAVITY, URINE: 1.026 (ref 1.005–1.030)
pH: 6 (ref 5.0–8.0)

## 2014-12-21 LAB — COMPREHENSIVE METABOLIC PANEL
ALBUMIN: 4.2 g/dL (ref 3.5–5.0)
ALT: 14 U/L (ref 14–54)
AST: 18 U/L (ref 15–41)
Alkaline Phosphatase: 58 U/L (ref 38–126)
Anion gap: 5 (ref 5–15)
BILIRUBIN TOTAL: 0.9 mg/dL (ref 0.3–1.2)
BUN: 8 mg/dL (ref 6–20)
CALCIUM: 9.2 mg/dL (ref 8.9–10.3)
CO2: 25 mmol/L (ref 22–32)
Chloride: 108 mmol/L (ref 101–111)
Creatinine, Ser: 0.84 mg/dL (ref 0.44–1.00)
GFR calc Af Amer: >60 mL/min (ref >60)
GLUCOSE: 93 mg/dL (ref 65–99)
POTASSIUM: 3.9 mmol/L (ref 3.5–5.1)
Sodium: 138 mmol/L (ref 135–145)
TOTAL PROTEIN: 7.2 g/dL (ref 6.5–8.1)

## 2014-12-21 LAB — URINE MICROSCOPIC-ADD ON

## 2014-12-21 LAB — I-STAT BETA HCG BLOOD, ED (MC, WL, AP ONLY)

## 2014-12-21 NOTE — ED Notes (Signed)
EDP at bedside  

## 2014-12-21 NOTE — ED Notes (Addendum)
Pt states she was seen here 3 weeks ago for vaginal bleeding.  She was given hormones to stop the bleeding.  States the bleeding was stopped while she took the hormones, but now that the meds are finished, she is not bleeding every other day.  Pt states that her abdomen, from her epigastric area to her pelvis, "burns when she touches it".

## 2014-12-21 NOTE — Discharge Instructions (Signed)

## 2014-12-21 NOTE — ED Notes (Signed)
Pt A&OX4, ambulatory at d/c with steady gait and pt reports she has all of her belongings with her at discharge.

## 2014-12-21 NOTE — ED Provider Notes (Signed)
CSN: [id number]     Arrival date & time 12/21/14  1445 History   First MD Initiated Contact with Patient 12/21/14 1715     Chief Complaint  Patient presents with  . Vaginal Bleeding  . Abdominal Pain     (Consider location/radiation/quality/duration/timing/severity/associated sxs/prior Treatment) HPI   Sally Allen is a 19 y.o. female who presents for evaluation of ongoing vaginal bleeding. She states that she is using pads and changing them to 15 times each day. She is here with a similar problem about 3 weeks ago. At that time was treated with Provera, which temporarily stopped the bleeding. She has a progestin implant, in her right arm, which was placed in June 2016. She denies vaginal discharge, fever, chills, nausea, vomiting. She has been having some diarrhea the last 2 days. Following the last visit, 3 weeks ago, diagnosis was made of UTI, a prescription for Macrobid was called in which she states that she took. There are no other known modifying factors.   Past Medical History  Diagnosis Date  . Anxiety   . Depression    Past Surgical History  Procedure Laterality Date  . No past surgeries     No family history on file. Social History  Substance Use Topics  . Smoking status: Current Every Day Smoker -- 0.50 packs/day    Types: Cigarettes  . Smokeless tobacco: None  . Alcohol Use: Yes     Comment: sociallly    OB History    Gravida Para Term Preterm AB TAB SAB Ectopic Multiple Living   1         1     Review of Systems  All other systems reviewed and are negative.     Allergies  Adderall  Home Medications   Prior to Admission medications   Medication Sig Start Date End Date Taking? Authorizing Provider  doxycycline (VIBRAMYCIN) 100 MG capsule Take 1 capsule (100 mg total) by mouth 2 (two) times daily. Patient not taking: Reported on 12/21/2014 11/29/14   Dione Boozeavid Glick, MD  medroxyPROGESTERone (PROVERA) 5 MG tablet Take 1 tablet (5 mg total) by mouth  daily. Patient not taking: Reported on 12/21/2014 11/29/14   Dione Boozeavid Glick, MD  ondansetron (ZOFRAN) 4 MG tablet Take 1 tablet (4 mg total) by mouth every 6 (six) hours as needed for nausea or vomiting. Patient not taking: Reported on 12/21/2014 11/29/14   Dione Boozeavid Glick, MD  oxyCODONE-acetaminophen (PERCOCET/ROXICET) 5-325 MG per tablet Take 1-2 tablets by mouth every 6 (six) hours as needed (moderate to severe pain (when tolerating fluids)). Patient not taking: Reported on 12/21/2014 09/12/14   Adam PhenixJames G Arnold, MD   BP 97/65 mmHg  Pulse 46  Temp(Src) 99 F (37.2 C) (Oral)  Resp 16  SpO2 100% Physical Exam  Constitutional: She is oriented to person, place, and time. She appears well-developed and well-nourished.  HENT:  Head: Normocephalic and atraumatic.  Right Ear: External ear normal.  Left Ear: External ear normal.  Eyes: Conjunctivae and EOM are normal. Pupils are equal, round, and reactive to light.  Neck: Normal range of motion and phonation normal. Neck supple.  Cardiovascular: Normal rate, regular rhythm and normal heart sounds.   Pulmonary/Chest: Effort normal and breath sounds normal. She exhibits no bony tenderness.  Abdominal: Soft. There is no tenderness.  Musculoskeletal: Normal range of motion.  Neurological: She is alert and oriented to person, place, and time. No cranial nerve deficit or sensory deficit. She exhibits normal muscle tone. Coordination normal.  Skin:  Skin is warm, dry and intact.  Psychiatric: She has a normal mood and affect. Her behavior is normal. Judgment and thought content normal.  Nursing note and vitals reviewed.   ED Course  Procedures (including critical care time)  Medications - No data to display  Patient Vitals for the past 24 hrs:  BP Temp Temp src Pulse Resp SpO2  12/21/14 1724 97/65 mmHg - - (!) 46 16 100 %  12/21/14 1512 100/76 mmHg 99 F (37.2 C) Oral 69 14 99 %    6:04 PM Reevaluation with update and discussion. After initial  assessment and treatment, an updated evaluation reveals findings discussed with the patient, all questions answered.Mancel Bale L    Labs Review Labs Reviewed  COMPREHENSIVE METABOLIC PANEL  CBC  URINALYSIS, ROUTINE W REFLEX MICROSCOPIC (NOT AT [facility])  I-STAT BETA HCG BLOOD, ED ([facility], [facility], [facility] ONLY)    Imaging Review No results found. I have personally reviewed and evaluated these images and lab results as part of my medical decision-making.   EKG Interpretation None      MDM   Final diagnoses:  Abnormal vaginal bleeding    Abnormal vaginal bleeding associated with implanted contraception device. No significant blood loss, and no pregnancy. Doubt acute infectious process, impending vascular collapse or metabolic instability.   Nursing Notes Reviewed/ Care Coordinated Applicable Imaging Reviewed Interpretation of Laboratory Data incorporated into ED treatment  The patient appears reasonably screened and/or stabilized for discharge and I doubt any other medical condition or other [facility] requiring further screening, evaluation, or treatment in the ED at this time prior to discharge.  Plan: Home Medications- usual; Home Treatments- rest; return here if the recommended treatment, does not improve the symptoms; Recommended follow up- GYN for further eval./treatment     Mancel Bale, MD 12/21/14 1807

## 2014-12-21 NOTE — ED Notes (Signed)
Called [facility] about i-stat preg results, stated that blood was collected but they never ran it and they will run it now

## 2015-01-03 ENCOUNTER — Emergency Department (HOSPITAL_COMMUNITY)
Admission: EM | Admit: 2015-01-03 | Discharge: 2015-01-03 | Discharge status: Against Medical Advice | Payer: BLUE CROSS/BLUE SHIELD | Attending: Dermatology | Admitting: Dermatology

## 2015-01-03 ENCOUNTER — Encounter (HOSPITAL_COMMUNITY): Payer: Self-pay

## 2015-01-03 DIAGNOSIS — R102 Pelvic and perineal pain: Secondary | ICD-10-CM | POA: Diagnosis present

## 2015-01-03 DIAGNOSIS — F17210 Nicotine dependence, cigarettes, uncomplicated: Secondary | ICD-10-CM | POA: Insufficient documentation

## 2015-01-03 NOTE — ED Notes (Signed)
CALL NO ANSWER 

## 2015-01-03 NOTE — ED Notes (Signed)
Pt states she would like to have her IUD removed. She has been seen here multiple times due to pain and bleeding from it and has been asking MDs here to remove it but has been told it can not be removed here. Pt states she not followed up with OBGYN since. Last time she was here she was given ABX due to UTI.

## 2015-01-03 NOTE — ED Notes (Signed)
CALL NO ANSWER

## 2015-01-06 ENCOUNTER — Encounter (HOSPITAL_COMMUNITY): Payer: Self-pay | Admitting: *Deleted

## 2015-01-06 ENCOUNTER — Emergency Department (HOSPITAL_COMMUNITY)
Admission: EM | Admit: 2015-01-06 | Discharge: 2015-01-06 | Disposition: A | Discharge status: Home / Self Care | Payer: BLUE CROSS/BLUE SHIELD | Attending: Emergency Medicine | Admitting: Emergency Medicine

## 2015-01-06 DIAGNOSIS — L089 Local infection of the skin and subcutaneous tissue, unspecified: Secondary | ICD-10-CM | POA: Diagnosis not present

## 2015-01-06 DIAGNOSIS — F17210 Nicotine dependence, cigarettes, uncomplicated: Secondary | ICD-10-CM | POA: Diagnosis not present

## 2015-01-06 DIAGNOSIS — Z8659 Personal history of other mental and behavioral disorders: Secondary | ICD-10-CM | POA: Diagnosis not present

## 2015-01-06 DIAGNOSIS — N390 Urinary tract infection, site not specified: Secondary | ICD-10-CM | POA: Diagnosis not present

## 2015-01-06 DIAGNOSIS — Z3202 Encounter for pregnancy test, result negative: Secondary | ICD-10-CM | POA: Insufficient documentation

## 2015-01-06 DIAGNOSIS — L02214 Cutaneous abscess of groin: Secondary | ICD-10-CM | POA: Diagnosis present

## 2015-01-06 LAB — URINE MICROSCOPIC-ADD ON

## 2015-01-06 LAB — URINALYSIS, ROUTINE W REFLEX MICROSCOPIC
BILIRUBIN URINE: NEGATIVE
Glucose, UA: NEGATIVE mg/dL
Ketones, ur: NEGATIVE mg/dL
NITRITE: NEGATIVE
PH: 5.5 (ref 5.0–8.0)
PROTEIN: NEGATIVE mg/dL
SPECIFIC GRAVITY, URINE: 1.026 (ref 1.005–1.030)

## 2015-01-06 LAB — PREGNANCY, URINE: PREG TEST UR: NEGATIVE

## 2015-01-06 MED ORDER — HYDROCODONE-ACETAMINOPHEN 5-325 MG PO TABS: 2.0000 | ORAL_TABLET | ORAL | Status: DC | PRN

## 2015-01-06 MED ORDER — CEPHALEXIN 500 MG PO CAPS: 500.0000 mg | ORAL_CAPSULE | Freq: Four times a day (QID) | ORAL | Status: DC

## 2015-01-06 NOTE — ED Notes (Signed)
Discharge instructions/prescriptions reviewed with patient. Understanding verbalized. Patient declined wheelchair at time of discharge. No distress noted. 
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
## 2015-01-06 NOTE — Discharge Instructions (Signed)

## 2015-01-06 NOTE — ED Notes (Signed)
The pt is c/o a vaginal abscess on her vagina since yesterday  lmp implant

## 2015-05-02 ENCOUNTER — Emergency Department (HOSPITAL_COMMUNITY)
Admission: EM | Admit: 2015-05-02 | Discharge: 2015-05-04 | Disposition: A | Discharge status: Other Acute Inpatient Hospital | Payer: BLUE CROSS/BLUE SHIELD | Attending: Emergency Medicine | Admitting: Emergency Medicine

## 2015-05-02 ENCOUNTER — Encounter (HOSPITAL_COMMUNITY): Payer: Self-pay | Admitting: Emergency Medicine

## 2015-05-02 DIAGNOSIS — Z792 Long term (current) use of antibiotics: Secondary | ICD-10-CM | POA: Diagnosis not present

## 2015-05-02 DIAGNOSIS — F17210 Nicotine dependence, cigarettes, uncomplicated: Secondary | ICD-10-CM | POA: Diagnosis not present

## 2015-05-02 DIAGNOSIS — R45851 Suicidal ideations: Secondary | ICD-10-CM | POA: Diagnosis present

## 2015-05-02 DIAGNOSIS — F329 Major depressive disorder, single episode, unspecified: Secondary | ICD-10-CM | POA: Insufficient documentation

## 2015-05-02 DIAGNOSIS — F1210 Cannabis abuse, uncomplicated: Secondary | ICD-10-CM | POA: Insufficient documentation

## 2015-05-02 DIAGNOSIS — M25562 Pain in left knee: Secondary | ICD-10-CM | POA: Diagnosis not present

## 2015-05-02 DIAGNOSIS — Z8742 Personal history of other diseases of the female genital tract: Secondary | ICD-10-CM | POA: Diagnosis not present

## 2015-05-02 DIAGNOSIS — Z3202 Encounter for pregnancy test, result negative: Secondary | ICD-10-CM | POA: Diagnosis not present

## 2015-05-02 HISTORY — DX: Female pelvic inflammatory disease, unspecified: N73.9

## 2015-05-02 LAB — COMPREHENSIVE METABOLIC PANEL
ALBUMIN: 4.4 g/dL (ref 3.5–5.0)
ALK PHOS: 62 U/L (ref 38–126)
ALT: 18 U/L (ref 14–54)
AST: 23 U/L (ref 15–41)
Anion gap: 9 (ref 5–15)
BILIRUBIN TOTAL: 1 mg/dL (ref 0.3–1.2)
BUN: 8 mg/dL (ref 6–20)
CALCIUM: 9.3 mg/dL (ref 8.9–10.3)
CO2: 24 mmol/L (ref 22–32)
Chloride: 106 mmol/L (ref 101–111)
Creatinine, Ser: 0.79 mg/dL (ref 0.44–1.00)
GFR calc Af Amer: >60 mL/min (ref >60)
GFR calc non Af Amer: >60 mL/min (ref >60)
GLUCOSE: 85 mg/dL (ref 65–99)
Potassium: 3.8 mmol/L (ref 3.5–5.1)
Sodium: 139 mmol/L (ref 135–145)
TOTAL PROTEIN: 7.3 g/dL (ref 6.5–8.1)

## 2015-05-02 LAB — POC URINE PREG, ED: Preg Test, Ur: NEGATIVE

## 2015-05-02 LAB — RAPID URINE DRUG SCREEN, HOSP PERFORMED
AMPHETAMINES: NOT DETECTED
BARBITURATES: NOT DETECTED
Benzodiazepines: NOT DETECTED
Cocaine: NOT DETECTED
Opiates: NOT DETECTED
TETRAHYDROCANNABINOL: POSITIVE — AB

## 2015-05-02 LAB — ETHANOL: Alcohol, Ethyl (B): <5 mg/dL (ref <5)

## 2015-05-02 LAB — CBC
HEMATOCRIT: 37.7 % (ref 36.0–46.0)
Hemoglobin: 12.8 g/dL (ref 12.0–15.0)
MCH: 30.9 pg (ref 26.0–34.0)
MCHC: 34 g/dL (ref 30.0–36.0)
MCV: 91.1 fL (ref 78.0–100.0)
Platelets: 288 10*3/uL (ref 150–400)
RBC: 4.14 MIL/uL (ref 3.87–5.11)
RDW: 12.8 % (ref 11.5–15.5)
WBC: 6.6 10*3/uL (ref 4.0–10.5)

## 2015-05-02 LAB — SALICYLATE LEVEL: Salicylate Lvl: <4 mg/dL (ref 2.8–30.0)

## 2015-05-02 LAB — ACETAMINOPHEN LEVEL: Acetaminophen (Tylenol), Serum: <10 ug/mL — ABNORMAL LOW (ref 10–30)

## 2015-05-02 NOTE — ED Notes (Signed)
Pt. reports depression and suicidal ideation plans to hang herself , denies hallucinations . Personal belongings/clothes bagged and given to mother to take home at triage .

## 2015-05-02 NOTE — ED Notes (Signed)
Staffing, Security and ED Charge Nurse have been notified.  Pt give scrubs to change into and blood work has been drawn.
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
## 2015-05-03 ENCOUNTER — Emergency Department (HOSPITAL_COMMUNITY): Payer: BLUE CROSS/BLUE SHIELD

## 2015-05-03 NOTE — ED Notes (Signed)
Pt inquiring on xray results.  Pt reassured that there is no fracture.  Ice pack offered, pt declined.

## 2015-05-03 NOTE — ED Notes (Signed)
Patient taken to xray.
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
## 2015-05-03 NOTE — ED Notes (Signed)
Patient returned from xray.

## 2015-05-03 NOTE — Progress Notes (Signed)
Patient was reccommended psychiatric inpatient treatment on 3/31, per Dr. Elna BreslowEappen. Currently no [facility] beds.   Patient has been referred to: [facility], will review. [facility] - per Arlys JohnBrian, fax it. [facility] - per Dondra SpryGail, accepting referrals. [facility] - [facility], will look at it. [facility] - per [place], 1 adult female and 1 adult female beds open. [facility] - per intake, accepting referrals for the waitlist.  At capacity: [facility] - per Merrill Lynchlba [place] - [facility] - per Raen[facility] Roverob [facility] - [facility]   CSW will continue to follow up with placement.  Sally Allen, LCSWA Disposition staff 05/03/2015 10:56 PM

## 2015-05-03 NOTE — [facility] Counselor (Signed)
TTS counselor Tobi BastosAnna reviewed pt's chart in preparation for assessment. Counselor is currently assisting in Obs unit but will see pt via tele-assessment as soon as other TTS counselor has completed the prior TTS assessment and cart is available.   - Cyndie MullAnna Dejanay Wamboldt, [facility]   Therapeutic Triage   [facility]

## 2015-05-04 NOTE — ED Notes (Signed)
Pt on phone advising her mother of being [facility].

## 2015-05-04 NOTE — ED Provider Notes (Signed)
  Physical Exam  BP 103/74 mmHg  Pulse 69  Temp(Src) 98.2 F (36.8 C) (Oral)  Resp 18  SpO2 100%  Physical Exam  ED Course  Procedures  MDM [facility], Dr Roselle LocusSaeed.      Benjiman CoreNathan Waymon Laser, MD 05/04/15 [phone]

## 2015-05-04 NOTE — ED Notes (Signed)
Attempt[facility] to call [facility] to inquire if pt needs to be IVC - no answer. Will re-attempt.

## 2015-05-04 NOTE — ED Notes (Signed)
[facility] to call [facility] - no answer. Tried 2 different numbers.

## 2015-05-04 NOTE — ED Notes (Signed)
[facility] Simonne ComeLeo, [facility] as [facility].

## 2015-05-04 NOTE — BH Assessment (Signed)
Received call from [place] [facility] who said Pt has been accepted to their facility by Dr. Roselle LocusSaeed after 0800. Number for nursing report is [phone]. Notified Dr. Azalia BilisKevin Campos and Gabriel RungMonique, RN of acceptance.  Harlin RainFord Ellis Patsy BaltimoreWarrick Jr, LPC, [facility], [facility] Triage Specialist [phone]

## 2016-03-03 ENCOUNTER — Encounter (HOSPITAL_COMMUNITY): Payer: Self-pay | Admitting: Family Medicine

## 2016-03-03 ENCOUNTER — Emergency Department (HOSPITAL_COMMUNITY)
Admission: EM | Admit: 2016-03-03 | Discharge: 2016-03-03 | Disposition: A | Discharge status: Home / Self Care | Payer: BLUE CROSS/BLUE SHIELD | Attending: Dermatology | Admitting: Dermatology

## 2016-03-03 DIAGNOSIS — Z5321 Procedure and treatment not carried out due to patient leaving prior to being seen by health care provider: Secondary | ICD-10-CM | POA: Insufficient documentation

## 2016-03-03 DIAGNOSIS — R51 Headache: Secondary | ICD-10-CM | POA: Insufficient documentation

## 2016-03-03 DIAGNOSIS — Z87891 Personal history of nicotine dependence: Secondary | ICD-10-CM | POA: Insufficient documentation

## 2016-03-03 NOTE — ED Notes (Signed)
Pt states she is not waiting any longer  Gave registration clerk her stickers and walked out

## 2016-03-03 NOTE — ED Triage Notes (Signed)
Patient is complaining of headache that started 2 weeks ago. Pt is complaining of nausea when she eats certain fruit and light sensitivity. Pt has took Tylenol, used vinegar/Coco Cola, Migraine Excedrin, and Ibuprofen with mild relief.

## 2016-03-04 ENCOUNTER — Emergency Department (HOSPITAL_COMMUNITY)
Admission: EM | Admit: 2016-03-04 | Discharge: 2016-03-04 | Disposition: A | Discharge status: Home / Self Care | Payer: BLUE CROSS/BLUE SHIELD | Attending: Emergency Medicine | Admitting: Emergency Medicine

## 2016-03-04 ENCOUNTER — Encounter (HOSPITAL_COMMUNITY): Payer: Self-pay | Admitting: Emergency Medicine

## 2016-03-04 DIAGNOSIS — Z87891 Personal history of nicotine dependence: Secondary | ICD-10-CM | POA: Insufficient documentation

## 2016-03-04 DIAGNOSIS — R51 Headache: Secondary | ICD-10-CM | POA: Insufficient documentation

## 2016-03-04 DIAGNOSIS — R519 Headache, unspecified: Secondary | ICD-10-CM

## 2016-03-04 DIAGNOSIS — Z79899 Other long term (current) drug therapy: Secondary | ICD-10-CM | POA: Insufficient documentation

## 2016-03-04 MED ORDER — DEXAMETHASONE SODIUM PHOSPHATE 10 MG/ML IJ SOLN
10.0000 mg | Freq: Once | INTRAMUSCULAR | Status: AC
  Administered 2016-03-04: 10 mg via INTRAMUSCULAR
  Filled 2016-03-04: qty 1

## 2016-03-04 MED ORDER — KETOROLAC TROMETHAMINE 10 MG PO TABS: 10.0000 mg | ORAL_TABLET | Freq: Four times a day (QID) | ORAL | 0 refills | Status: DC | PRN

## 2016-03-04 MED ORDER — KETOROLAC TROMETHAMINE 10 MG PO TABS: 10.0000 mg | ORAL_TABLET | Freq: Four times a day (QID) | ORAL | 0 refills | Status: AC | PRN

## 2016-03-04 MED ORDER — KETOROLAC TROMETHAMINE 60 MG/2ML IM SOLN
60.0000 mg | Freq: Once | INTRAMUSCULAR | Status: AC
  Administered 2016-03-04: 60 mg via INTRAMUSCULAR
  Filled 2016-03-04: qty 2

## 2016-03-04 MED ORDER — DIPHENHYDRAMINE HCL 25 MG PO CAPS
25.0000 mg | ORAL_CAPSULE | Freq: Once | ORAL | Status: AC
  Administered 2016-03-04: 25 mg via ORAL
  Filled 2016-03-04: qty 1

## 2016-03-04 NOTE — Discharge Instructions (Signed)
Your exam was reassuring. Please take your medications as prescribed. Follow-up with your doctor for further evaluation and management of your symptoms. Return to ED for new or worsening symptoms as we discussed

## 2016-03-04 NOTE — ED Triage Notes (Addendum)
migrain x 2 weeks  Rads to back occ, denies dysuria, pink tinged sputum  also has a cough  X 2 months

## 2016-03-04 NOTE — ED Provider Notes (Signed)
[facility] DEPT Provider Note   CSN: [id number] Arrival date & time: 03/04/16  1002     History   Chief Complaint Chief Complaint  Patient presents with  . Headache    HPI Sally Allen is a 21 y.o. female.  HPI here for evaluation of headache. Patient reports throbbing headache to the right side of her head and behind her eye that has been ongoing and intermittent over the past 2 weeks. She reports it is waxing and waning. He has had headaches like this in the past, but not lasting this long. She has had minimal relief with Tylenol and ibuprofen. No overt vision changes, numbness or weakness, fevers or chills. Wants to make sure it is not due to high blood pressure. Denies cocaine use.  Past Medical History:  Diagnosis Date  . Anxiety   . Depression   . PID (pelvic inflammatory disease)     Patient Active Problem List   Diagnosis Date Noted  . Abdominal pain, RUQ (right upper quadrant) 09/10/2014  . PID (acute pelvic inflammatory disease) 09/09/2014    Past Surgical History:  Procedure Laterality Date  . NO PAST SURGERIES      OB History    Gravida Para Term Preterm AB Living   1         1   SAB TAB Ectopic Multiple Live Births           1       Home Medications    Prior to Admission medications   Medication Sig Start Date End Date Taking? Authorizing Provider  clonazePAM (KLONOPIN) 0.5 MG tablet Take 0.5 mg by mouth daily as needed for anxiety.   Yes Historical Provider, MD  etonogestrel (NEXPLANON) 68 MG IMPL implant 1 each by Subdermal route once.   Yes Historical Provider, MD  QUEtiapine (SEROQUEL) 100 MG tablet Take 100 mg by mouth at bedtime.   Yes Historical Provider, MD  sertraline (ZOLOFT) 100 MG tablet Take 100 mg by mouth daily.   Yes Historical Provider, MD  ketorolac (TORADOL) 10 MG tablet Take 1 tablet (10 mg total) by mouth every 6 (six) hours as needed. 03/04/16   Joycie PeekBenjamin Nene Aranas, PA-C    Family History No family history on  file.  Social History Social History  Substance Use Topics  . Smoking status: Former Smoker    Packs/day: 0.00    Types: Cigarettes  . Smokeless tobacco: Never Used  . Alcohol use No     Allergies   Adderall [amphetamine-dextroamphetamine]   Review of Systems Review of Systems A 10 point review of systems was completed and was negative except for pertinent positives and negatives as mentioned in the history of present illness    Physical Exam Updated Vital Signs BP 109/75   Pulse 69   Temp 98.7 F (37.1 C) (Oral)   Resp 15   SpO2 100%   Physical Exam  Constitutional: She is oriented to person, place, and time. She appears well-developed and well-nourished. No distress.  HENT:  Head: Normocephalic and atraumatic.  Mouth/Throat: Oropharynx is clear and moist.  Eyes: Conjunctivae and EOM are normal. Pupils are equal, round, and reactive to light.  Neck: Normal range of motion.  No meningismus or nuchal rigidity  Cardiovascular: Normal rate, regular rhythm and normal heart sounds.   Pulmonary/Chest: Effort normal and breath sounds normal.  Abdominal: Soft. There is no tenderness.  Musculoskeletal: Normal range of motion. She exhibits no edema or tenderness.  Neurological: She is alert and  oriented to person, place, and time.  Cranial nerves III through XII grossly intact. Motor strength is 5/5 in all 4 extremities and sensation is intact to light touch. Completes finger to nose coordination movements without difficulty. No nystagmus. Gait is baseline without ataxia.  Skin: No rash noted. She is not diaphoretic.  Psychiatric: She has a normal mood and affect. Her behavior is normal. Judgment and thought content normal.  Nursing note and vitals reviewed.    ED Treatments / Results  Labs (all labs ordered are listed, but only abnormal results are displayed) Labs Reviewed - No data to display  EKG  EKG Interpretation None       Radiology No results  found.  Procedures Procedures (including critical care time)  Medications Ordered in ED Medications  ketorolac (TORADOL) injection 60 mg (60 mg Intramuscular Given 03/04/16 1243)  dexamethasone (DECADRON) injection 10 mg (10 mg Intramuscular Given 03/04/16 1244)  diphenhydrAMINE (BENADRYL) capsule 25 mg (25 mg Oral Given 03/04/16 1242)     Initial Impression / Assessment and Plan / ED Course  I have reviewed the triage vital signs and the nursing notes.  Pertinent labs & imaging results that were available during my care of the patient were reviewed by me and considered in my medical decision making (see chart for details).     Pt HA treated and improved while in ED-Symptoms have resolved.  Presentation is like pts typical HA and non concerning for [facility], ICH, Meningitis, or temporal arteritis. Pt is afebrile with no focal neuro deficits, nuchal rigidity, or change in vision. Pt is to follow up with PCP to discuss prophylactic medication. Given prescription for short course Toradol. Pt verbalizes understanding and is agreeable with plan to dc.    Final Clinical Impressions(s) / ED Diagnoses   Final diagnoses:  Nonintractable headache, unspecified chronicity pattern, unspecified headache type    New Prescriptions New Prescriptions   KETOROLAC (TORADOL) 10 MG TABLET    Take 1 tablet (10 mg total) by mouth every 6 (six) hours as needed.     Joycie Peek, PA-C 03/04/16 1327    Mancel Bale, MD 03/04/16 [phone]

## 2016-03-12 ENCOUNTER — Encounter (HOSPITAL_COMMUNITY): Payer: Self-pay | Admitting: Emergency Medicine

## 2016-03-12 ENCOUNTER — Emergency Department (HOSPITAL_COMMUNITY)
Admission: EM | Admit: 2016-03-12 | Discharge: 2016-03-12 | Disposition: A | Discharge status: Home / Self Care | Payer: BLUE CROSS/BLUE SHIELD | Attending: Emergency Medicine | Admitting: Emergency Medicine

## 2016-03-12 DIAGNOSIS — N390 Urinary tract infection, site not specified: Secondary | ICD-10-CM | POA: Diagnosis not present

## 2016-03-12 DIAGNOSIS — Z87891 Personal history of nicotine dependence: Secondary | ICD-10-CM | POA: Insufficient documentation

## 2016-03-12 DIAGNOSIS — R1030 Lower abdominal pain, unspecified: Secondary | ICD-10-CM | POA: Diagnosis present

## 2016-03-12 LAB — CBC
HCT: 40 % (ref 36.0–46.0)
HEMOGLOBIN: 13.4 g/dL (ref 12.0–15.0)
MCH: 31.5 pg (ref 26.0–34.0)
MCHC: 33.5 g/dL (ref 30.0–36.0)
MCV: 93.9 fL (ref 78.0–100.0)
Platelets: 247 10*3/uL (ref 150–400)
RBC: 4.26 MIL/uL (ref 3.87–5.11)
RDW: 13.8 % (ref 11.5–15.5)
WBC: 8.5 10*3/uL (ref 4.0–10.5)

## 2016-03-12 LAB — URINALYSIS, ROUTINE W REFLEX MICROSCOPIC
BILIRUBIN URINE: NEGATIVE
GLUCOSE, UA: NEGATIVE mg/dL
Ketones, ur: NEGATIVE mg/dL
Nitrite: POSITIVE — AB
Protein, ur: 100 mg/dL — AB
SPECIFIC GRAVITY, URINE: 1.018 (ref 1.005–1.030)
pH: 7 (ref 5.0–8.0)

## 2016-03-12 LAB — COMPREHENSIVE METABOLIC PANEL
ALT: 19 U/L (ref 14–54)
ANION GAP: 8 (ref 5–15)
AST: 20 U/L (ref 15–41)
Albumin: 4.5 g/dL (ref 3.5–5.0)
Alkaline Phosphatase: 63 U/L (ref 38–126)
BUN: 9 mg/dL (ref 6–20)
CALCIUM: 9.5 mg/dL (ref 8.9–10.3)
CHLORIDE: 105 mmol/L (ref 101–111)
CO2: 24 mmol/L (ref 22–32)
CREATININE: 0.86 mg/dL (ref 0.44–1.00)
GFR calc Af Amer: >60 mL/min (ref >60)
Glucose, Bld: 102 mg/dL — ABNORMAL HIGH (ref 65–99)
Potassium: 4.3 mmol/L (ref 3.5–5.1)
Sodium: 137 mmol/L (ref 135–145)
Total Bilirubin: 0.6 mg/dL (ref 0.3–1.2)
Total Protein: 8 g/dL (ref 6.5–8.1)

## 2016-03-12 LAB — I-STAT BETA HCG BLOOD, ED (MC, WL, AP ONLY): I-stat hCG, quantitative: <5 m[IU]/mL (ref <5)

## 2016-03-12 LAB — LIPASE, BLOOD: LIPASE: 19 U/L (ref 11–51)

## 2016-03-12 MED ORDER — CEPHALEXIN 500 MG PO CAPS: 500.0000 mg | ORAL_CAPSULE | Freq: Two times a day (BID) | ORAL | 0 refills | Status: AC

## 2016-03-12 MED ORDER — DEXTROSE 5 % IV SOLN
1.0000 g | Freq: Once | INTRAVENOUS | Status: AC
  Administered 2016-03-12: 1 g via INTRAVENOUS
  Filled 2016-03-12: qty 10

## 2016-03-12 MED ORDER — KETOROLAC TROMETHAMINE 30 MG/ML IJ SOLN
30.0000 mg | Freq: Once | INTRAMUSCULAR | Status: AC
  Administered 2016-03-12: 30 mg via INTRAVENOUS
  Filled 2016-03-12: qty 1

## 2016-03-12 MED ORDER — SODIUM CHLORIDE 0.9 % IV BOLUS (SEPSIS)
1000.0000 mL | Freq: Once | INTRAVENOUS | Status: AC
  Administered 2016-03-12: 1000 mL via INTRAVENOUS

## 2016-03-12 NOTE — Discharge Instructions (Signed)
Please read and follow all provided instructions.  Your diagnoses today include:  1. Urinary tract infection without hematuria, site unspecified    Tests performed today include: Urine test - suggests that you have an infection in your bladder Vital signs. See below for your results today.   Medications prescribed:  Take as prescribed   Home care instructions:  Follow any educational materials contained in this packet.  Follow-up instructions: Please follow-up with your primary care provider in 3 days if symptoms are not resolved for further evaluation of your symptoms.  Return instructions:  Please return to the Emergency Department if you experience worsening symptoms.  Return with fever, worsening pain, persistent vomiting, worsening pain in your back.  Please return if you have any other emergent concerns.  Additional Information:  Your vital signs today were: BP 102/74 (BP Location: Right Arm)    Pulse 86    Temp 98.4 F (36.9 C) (Oral)    Resp 12    Ht 5\' 5"  (1.651 m)    Wt 58.1 kg    SpO2 100%    BMI 21.30 kg/m  If your blood pressure (BP) was elevated above 135/85 this visit, please have this repeated by your doctor within one month. --------------

## 2016-03-12 NOTE — ED Triage Notes (Signed)
Pt sts abd pain into back; pt sts hx of liver infection; pt requests pregnancy test

## 2016-03-12 NOTE — ED Provider Notes (Signed)
[facility] DEPT Provider Note   CSN: [id number] Arrival date & time: 03/12/16  1144     History   Chief Complaint Chief Complaint  Patient presents with  . Abdominal Pain    HPI Sally Allen is a 21 y.o. female.  HPI  21 y.o. female, presents to the Emergency Department today complaining of abdominal pain with onset x 2 days ago. Pt states that it is lower abdomen and feels like a pressure sensation. Notes mild dysuria. Denies vaginal bleeding or discharge. No N/V. No CP/SOB. States abdominal pain is 10/10 and states that it radiates from lower abdomen to lower back bilaterally. No fevers at home. No other symptoms noted.   Past Medical History:  Diagnosis Date  . Anxiety   . Depression   . PID (pelvic inflammatory disease)     Patient Active Problem List   Diagnosis Date Noted  . Abdominal pain, RUQ (right upper quadrant) 09/10/2014  . PID (acute pelvic inflammatory disease) 09/09/2014    Past Surgical History:  Procedure Laterality Date  . NO PAST SURGERIES      OB History    Gravida Para Term Preterm AB Living   1         1   SAB TAB Ectopic Multiple Live Births           1       Home Medications    Prior to Admission medications   Medication Sig Start Date End Date Taking? Authorizing Provider  clonazePAM (KLONOPIN) 0.5 MG tablet Take 0.5 mg by mouth daily as needed for anxiety.    Historical Provider, MD  etonogestrel (NEXPLANON) 68 MG IMPL implant 1 each by Subdermal route once.    Historical Provider, MD  ketorolac (TORADOL) 10 MG tablet Take 1 tablet (10 mg total) by mouth every 6 (six) hours as needed. 03/04/16   Joycie PeekBenjamin Cartner, PA-C  QUEtiapine (SEROQUEL) 100 MG tablet Take 100 mg by mouth at bedtime.    Historical Provider, MD  sertraline (ZOLOFT) 100 MG tablet Take 100 mg by mouth daily.    Historical Provider, MD    Family History History reviewed. No pertinent family history.  Social History Social History  Substance Use Topics  .  Smoking status: Former Smoker    Packs/day: 0.00    Types: Cigarettes  . Smokeless tobacco: Never Used  . Alcohol use No     Allergies   Adderall [amphetamine-dextroamphetamine]   Review of Systems Review of Systems ROS reviewed and all are negative for acute change except as noted in the HPI.  Physical Exam Updated Vital Signs BP 102/74 (BP Location: Right Arm)   Pulse 86   Temp 98.4 F (36.9 C) (Oral)   Resp 12   Ht 5\' 5"  (1.651 m)   Wt 58.1 kg   SpO2 100%   BMI 21.30 kg/m   Physical Exam  Constitutional: She is oriented to person, place, and time. Vital signs are normal. She appears well-developed and well-nourished.  HENT:  Head: Normocephalic and atraumatic.  Right Ear: Hearing normal.  Left Ear: Hearing normal.  Eyes: Conjunctivae and EOM are normal. Pupils are equal, round, and reactive to light.  Neck: Normal range of motion. Neck supple.  Cardiovascular: Normal rate, regular rhythm, normal heart sounds and intact distal pulses.   No murmur heard. Pulmonary/Chest: Effort normal and breath sounds normal. No respiratory distress. She has no wheezes.  Abdominal: Soft. Normal appearance and bowel sounds are normal. There is tenderness in the  suprapubic area. There is no rigidity, no rebound, no guarding, no CVA tenderness, no tenderness at McBurney's point and negative Murphy's sign.  Musculoskeletal: Normal range of motion.  Neurological: She is alert and oriented to person, place, and time.  Skin: Skin is warm and dry.  Psychiatric: She has a normal mood and affect. Her speech is normal and behavior is normal. Thought content normal.  Nursing note and vitals reviewed.  ED Treatments / Results  Labs (all labs ordered are listed, but only abnormal results are displayed) Labs Reviewed  COMPREHENSIVE METABOLIC PANEL - Abnormal; Notable for the following:       Result Value   Glucose, Bld 102 (*)    All other components within normal limits  URINALYSIS, ROUTINE  W REFLEX MICROSCOPIC - Abnormal; Notable for the following:    APPearance CLOUDY (*)    Hgb urine dipstick MODERATE (*)    Protein, ur 100 (*)    Nitrite POSITIVE (*)    Leukocytes, UA LARGE (*)    Bacteria, UA RARE (*)    Squamous Epithelial / LPF 0-5 (*)    Non Squamous Epithelial 0-5 (*)    All other components within normal limits  LIPASE, BLOOD  CBC  I-STAT BETA HCG BLOOD, ED ([facility], [facility], [facility] ONLY)    EKG  EKG Interpretation None       Radiology No results found.  Procedures Procedures (including critical care time)  Medications Ordered in ED Medications  sodium chloride 0.9 % bolus 1,000 mL (not administered)  ketorolac (TORADOL) 30 MG/ML injection 30 mg (not administered)  cefTRIAXone (ROCEPHIN) 1 g in dextrose 5 % 50 mL IVPB (not administered)   Initial Impression / Assessment and Plan / ED Course  I have reviewed the triage vital signs and the nursing notes.  Pertinent labs & imaging results that were available during my care of the patient were reviewed by me and considered in my medical decision making (see chart for details).  Final Clinical Impressions(s) / ED Diagnoses  {I have reviewed and evaluated the relevant laboratory values.   {I have reviewed the relevant previous healthcare records.  {I obtained HPI from historian.   ED Course:  Assessment: Pt is a 21yF who presents with lower abdominal pain x 2 days. Note mild dysuria. No vaginal bleeding/discharge. No fevers. No N/V. No chills. On exam, pt in NAD. Nontoxic/nonseptic appearing. VSS. Afebrile. Lungs CTA. Heart RRR. Abdomen TTP suprpubic. Labs unremarkable. No leukocytosis. Neg pregnancy. UA shows UTI. Given Rocephin and fluids in ED. Plan is to DC home with ABX and follow up to PCP. At time of discharge, Patient is in no acute distress. Vital Signs are stable. Patient is able to ambulate. Patient able to tolerate PO.   Disposition/Plan:  DC Home Additional Verbal discharge instructions given and  discussed with patient.  Pt Instructed to f/u with PCP in the next week for evaluation and treatment of symptoms. Return precautions given Pt acknowledges and agrees with plan  Supervising Physician Lavera Guise, MD  Final diagnoses:  Urinary tract infection without hematuria, site unspecified    New Prescriptions New Prescriptions   No medications on file     Audry Pili, PA-C 03/12/16 1321    Lavera Guise, MD 03/12/16 [phone]

## 2016-03-15 LAB — URINE CULTURE

## 2016-03-16 ENCOUNTER — Telehealth: Payer: Self-pay | Admitting: Emergency Medicine

## 2016-03-16 NOTE — Telephone Encounter (Signed)
Post ED Visit - Positive Culture Follow-up  Culture report reviewed by antimicrobial stewardship pharmacist:  [x]  Enzo BiNathan Batchelder, Pharm.D. []  Celedonio MiyamotoJeremy Frens, Pharm.D., BCPS []  Garvin FilaMike Maccia, Pharm.D. []  Georgina PillionElizabeth Martin, Pharm.D., BCPS []  LemooreMinh Pham, [address] Rainbow BoulevardPharm.D., BCPS, AAHIVP []  Estella HuskMichelle Turner, Pharm.D., BCPS, AAHIVP []  Tennis Mustassie Stewart, Pharm.D. []  Sherle Poeob Vincent, [place].  Positive urine culture Treated with cephalexin, organism sensitive to the same and no further patient follow-up is required at this time.  Berle MullMiller, Amika Tassin 03/16/2016, 4:56 PM

## 2016-05-04 ENCOUNTER — Encounter (HOSPITAL_COMMUNITY): Payer: Self-pay | Admitting: *Deleted

## 2016-05-04 ENCOUNTER — Emergency Department (HOSPITAL_COMMUNITY)
Admission: EM | Admit: 2016-05-04 | Discharge: 2016-05-04 | Disposition: A | Discharge status: Home / Self Care | Payer: BLUE CROSS/BLUE SHIELD | Attending: Emergency Medicine | Admitting: Emergency Medicine

## 2016-05-04 DIAGNOSIS — N760 Acute vaginitis: Secondary | ICD-10-CM | POA: Diagnosis not present

## 2016-05-04 DIAGNOSIS — Z709 Sex counseling, unspecified: Secondary | ICD-10-CM | POA: Diagnosis not present

## 2016-05-04 DIAGNOSIS — A599 Trichomoniasis, unspecified: Secondary | ICD-10-CM | POA: Insufficient documentation

## 2016-05-04 DIAGNOSIS — B9689 Other specified bacterial agents as the cause of diseases classified elsewhere: Secondary | ICD-10-CM

## 2016-05-04 DIAGNOSIS — Z202 Contact with and (suspected) exposure to infections with a predominantly sexual mode of transmission: Secondary | ICD-10-CM | POA: Diagnosis not present

## 2016-05-04 DIAGNOSIS — N898 Other specified noninflammatory disorders of vagina: Secondary | ICD-10-CM | POA: Diagnosis present

## 2016-05-04 LAB — URINALYSIS, ROUTINE W REFLEX MICROSCOPIC
Bilirubin Urine: NEGATIVE
Glucose, UA: NEGATIVE mg/dL
Hgb urine dipstick: NEGATIVE
Ketones, ur: NEGATIVE mg/dL
NITRITE: NEGATIVE
Protein, ur: NEGATIVE mg/dL
SPECIFIC GRAVITY, URINE: 1.021 (ref 1.005–1.030)
pH: 5 (ref 5.0–8.0)

## 2016-05-04 LAB — CBC
HCT: 35.3 % — ABNORMAL LOW (ref 36.0–46.0)
Hemoglobin: 11.8 g/dL — ABNORMAL LOW (ref 12.0–15.0)
MCH: 31.5 pg (ref 26.0–34.0)
MCHC: 33.4 g/dL (ref 30.0–36.0)
MCV: 94.1 fL (ref 78.0–100.0)
PLATELETS: 270 10*3/uL (ref 150–400)
RBC: 3.75 MIL/uL — AB (ref 3.87–5.11)
RDW: 12.6 % (ref 11.5–15.5)
WBC: 5.5 10*3/uL (ref 4.0–10.5)

## 2016-05-04 LAB — COMPREHENSIVE METABOLIC PANEL
ALK PHOS: 62 U/L (ref 38–126)
ALT: 18 U/L (ref 14–54)
AST: 23 U/L (ref 15–41)
Albumin: 3.8 g/dL (ref 3.5–5.0)
Anion gap: 7 (ref 5–15)
BILIRUBIN TOTAL: 0.4 mg/dL (ref 0.3–1.2)
BUN: 8 mg/dL (ref 6–20)
CALCIUM: 8.9 mg/dL (ref 8.9–10.3)
CO2: 28 mmol/L (ref 22–32)
CREATININE: 0.93 mg/dL (ref 0.44–1.00)
Chloride: 105 mmol/L (ref 101–111)
Glucose, Bld: 86 mg/dL (ref 65–99)
Potassium: 3.7 mmol/L (ref 3.5–5.1)
SODIUM: 140 mmol/L (ref 135–145)
Total Protein: 6.9 g/dL (ref 6.5–8.1)

## 2016-05-04 LAB — I-STAT BETA HCG BLOOD, ED (MC, WL, AP ONLY)

## 2016-05-04 LAB — WET PREP, GENITAL
Sperm: NONE SEEN
Yeast Wet Prep HPF POC: NONE SEEN

## 2016-05-04 MED ORDER — METRONIDAZOLE 500 MG PO TABS: 500.0000 mg | ORAL_TABLET | Freq: Two times a day (BID) | ORAL | 0 refills | Status: AC

## 2016-05-04 NOTE — ED Triage Notes (Signed)
PT states she would like to know if she' pregnant.  States she normally has her period the same time her friend does, but she hasn't had a menstrual cycle since the beginning of Feb.  Also states irregular periods since she's had a implant.  States some nausea, abdominal pain and darker discharge than normal.  Took pregnancy test that was neg.

## 2016-05-04 NOTE — ED Provider Notes (Addendum)
[facility] Provider Note    By signing my name below, I, Earmon Phoenix, attest that this documentation has been prepared under the direction and in the presence of Lavera Guise, MD. Electronically Signed: Earmon Phoenix, ED Scribe. 05/04/16. 7:49 PM.    History   Chief Complaint Chief Complaint  Patient presents with  . Possible Pregnancy    The history is provided by the patient and medical records. No language interpreter was used.    Kathrine Rieves is a 21 y.o. female with PMHx of anxiety, depression and PID who presents to the Emergency Department wanting an STD check. She states she just finished treatment for a UTI with a course of antibiotics. She reports associated vaginal discharge and occasional dyspareunia. She has not done anything to treat her symptoms. There are no modifying factors. She denies vaginal bleeding, fever, chills, nausea, vomiting, diarrhea, dysuria, frequency in urination, abdominal pain.   Past Medical History:  Diagnosis Date  . Anxiety   . Depression   . PID (pelvic inflammatory disease)     Patient Active Problem List   Diagnosis Date Noted  . Abdominal pain, RUQ (right upper quadrant) 09/10/2014  . PID (acute pelvic inflammatory disease) 09/09/2014    Past Surgical History:  Procedure Laterality Date  . NO PAST SURGERIES      OB History    Gravida Para Term Preterm AB Living   1         1   SAB TAB Ectopic Multiple Live Births           1       Home Medications    Prior to Admission medications   Medication Sig Start Date End Date Taking? Authorizing Provider  etonogestrel (NEXPLANON) 68 MG IMPL implant 1 each by Subdermal route once.   Yes Historical Provider, MD  clonazePAM (KLONOPIN) 0.5 MG tablet Take 0.5 mg by mouth daily as needed for anxiety.    Historical Provider, MD  ketorolac (TORADOL) 10 MG tablet Take 1 tablet (10 mg total) by mouth every 6 (six) hours as needed. Patient not taking: Reported on 05/04/2016  03/04/16   Joycie Peek, PA-C  lamoTRIgine (LAMICTAL) 25 MG tablet Take 50 mg by mouth daily.    Historical Provider, MD  metroNIDAZOLE (FLAGYL) 500 MG tablet Take 1 tablet (500 mg total) by mouth 2 (two) times daily. 05/04/16   Lavera Guise, MD  QUEtiapine (SEROQUEL) 100 MG tablet Take 100 mg by mouth at bedtime.    Historical Provider, MD  sertraline (ZOLOFT) 100 MG tablet Take 100 mg by mouth daily.    Historical Provider, MD    Family History No family history on file.  Social History Social History  Substance Use Topics  . Smoking status: Former Smoker    Packs/day: 0.00    Types: Cigarettes  . Smokeless tobacco: Never Used  . Alcohol use No     Allergies   Adderall [amphetamine-dextroamphetamine]   Review of Systems Review of Systems 10/14 systems reviewed and are negative other than those stated in the HPI   Physical Exam Updated Vital Signs BP (!) 95/59 (BP Location: Left Arm)   Pulse 72   Temp 98.8 F (37.1 C) (Oral)   Resp 19   Ht  (1.651 m)   Wt 128 lb (58.1 kg)   LMP 03/05/2016   SpO2 100%   BMI 21.30 kg/m   Physical Exam Physical Exam  Nursing note and vitals reviewed. Constitutional: Well developed, well  nourished, non-toxic, and in no acute distress Head: Normocephalic and atraumatic.  Mouth/Throat: Oropharynx is clear and moist.  Neck: Normal range of motion. Neck supple.  Cardiovascular: Normal rate and regular rhythm.   Pulmonary/Chest: Effort normal and breath sounds normal.  Abdominal: Soft. There is no tenderness. There is no rebound and no guarding.  Musculoskeletal: Normal range of motion.  Neurological: Alert, no facial droop, fluent speech, moves all extremities symmetrically Skin: Skin is warm and dry.  Psychiatric: Cooperative Pelvic: Normal external genitalia. Normal internal genitalia. Moderate white vaginal discharge. No blood within the vagina. No cervical motion tenderness. No adnexal masses or tenderness.    ED  Treatments / Results  DIAGNOSTIC STUDIES: Oxygen Saturation is 100% on RA, normal by my interpretation.   COORDINATION OF CARE: 7:38 PM- Will order STD panel and perform pelvic exam. Pt verbalizes understanding and agrees to plan.  Medications - No data to display  Labs (all labs ordered are listed, but only abnormal results are displayed) Labs Reviewed  WET PREP, GENITAL - Abnormal; Notable for the following:       Result Value   Trich, Wet Prep PRESENT (*)    Clue Cells Wet Prep HPF POC PRESENT (*)    WBC, Wet Prep HPF POC MODERATE (*)    All other components within normal limits  CBC - Abnormal; Notable for the following:    RBC 3.75 (*)    Hemoglobin 11.8 (*)    HCT 35.3 (*)    All other components within normal limits  URINALYSIS, ROUTINE W REFLEX MICROSCOPIC - Abnormal; Notable for the following:    APPearance HAZY (*)    Leukocytes, UA LARGE (*)    Bacteria, UA RARE (*)    Squamous Epithelial / LPF 6-30 (*)    All other components within normal limits  URINE CULTURE  COMPREHENSIVE METABOLIC PANEL  HIV ANTIBODY (ROUTINE TESTING)  RPR  I-STAT BETA HCG BLOOD, ED ([facility], [facility], [facility] ONLY)  GC/CHLAMYDIA PROBE AMP ([place]) NOT AT [facility]    EKG  EKG Interpretation None       Radiology No results found.  Procedures Pelvic exam Date/Time: 05/04/2016 7:49 PM Performed by: Crista Curb DUO Authorized by: Crista Curb DUO  Consent: Verbal consent obtained. Consent given by: patient Patient understanding: patient states understanding of the procedure being performed Patient consent: the patient's understanding of the procedure matches consent given Procedure consent: procedure consent matches procedure scheduled Relevant documents: relevant documents present and verified Test results: test results available and properly labeled Required items: required blood products, implants, devices, and special equipment available Patient identity confirmed: verbally with  patient Preparation: Patient was prepped and draped in the usual sterile fashion. Local anesthesia used: no  Anesthesia: Local anesthesia used: no  Sedation: Patient sedated: no Patient tolerance: Patient tolerated the procedure well with no immediate complications    (including critical care time)  Medications Ordered in ED Medications - No data to display   Initial Impression / Assessment and Plan / ED Course  I have reviewed the triage vital signs and the nursing notes.  Pertinent labs & imaging results that were available during my care of the patient were reviewed by me and considered in my medical decision making (see chart for details).     21 year old female who presents with pregnancy and STD check. She is well-appearing and in no acute distress with normal vital signs. Has a soft and benign abdomen. Pelvic exam is not concerning for PID or TOA.  STD testing obtained. Wet prep is positive for Trichomonas and bacterial vaginosis. Full treat with course of Flagyl. GC chlamydia testing pending, and she has deferred treatment at this time pending results. She is not pregnant. Safe sex counseling and resources were provided. UA w/ moderate WBCs and bacteria, but just treated for UTI and no urinary symptoms. Suspect maybe related to STD.   The patient appears reasonably screened and/or stabilized for discharge and I doubt any other medical condition or other [facility] requiring further screening, evaluation, or treatment in the ED at this time prior to discharge.  Strict return and follow-up instructions reviewed. She expressed understanding of all discharge instructions and felt comfortable with the plan of care.  Final Clinical Impressions(s) / ED Diagnoses   Final diagnoses:  Bacterial vaginosis  Trichomonas infection    New Prescriptions New Prescriptions   METRONIDAZOLE (FLAGYL) 500 MG TABLET    Take 1 tablet (500 mg total) by mouth 2 (two) times daily.    I personally  performed the services described in this documentation, which was scribed in my presence. The recorded information has been reviewed and is accurate.     Lavera Guise, MD 05/04/16 4098    Lavera Guise, MD 05/04/16 2104

## 2016-05-04 NOTE — ED Notes (Signed)
Possible exposure to STDs and possible pregnancy. VSS. Complaining of discharge but denies dysuria, abdominal pain, fever.

## 2016-05-04 NOTE — Discharge Instructions (Signed)
Please take antibiotics as prescribed. Please let your partner know to also get treated. Please also check your results online for the other testing, but we will usually call you if the testing is positive.  Return without fail for worsening symptoms, including fever, severe abdominal pain, intractable vomiting, or any other symptoms concerning to you.

## 2016-05-05 LAB — GC/CHLAMYDIA PROBE AMP (~~LOC~~) NOT AT ARMC
CHLAMYDIA, DNA PROBE: POSITIVE — AB
NEISSERIA GONORRHEA: POSITIVE — AB

## 2016-05-05 LAB — RPR: RPR Ser Ql: NONREACTIVE

## 2016-05-05 LAB — HIV ANTIBODY (ROUTINE TESTING W REFLEX): HIV SCREEN 4TH GENERATION: NONREACTIVE

## 2016-05-07 LAB — URINE CULTURE

## 2016-05-08 ENCOUNTER — Telehealth: Payer: Self-pay

## 2016-05-08 NOTE — Telephone Encounter (Signed)
No treatment for UC 05/04/16 inED per Marlon Pel PA

## 2016-05-09 ENCOUNTER — Encounter (HOSPITAL_COMMUNITY): Payer: Self-pay | Admitting: *Deleted

## 2016-05-09 ENCOUNTER — Emergency Department (HOSPITAL_COMMUNITY)
Admission: EM | Admit: 2016-05-09 | Discharge: 2016-05-09 | Disposition: A | Discharge status: Home / Self Care | Payer: BLUE CROSS/BLUE SHIELD | Attending: Emergency Medicine | Admitting: Emergency Medicine

## 2016-05-09 DIAGNOSIS — A749 Chlamydial infection, unspecified: Secondary | ICD-10-CM | POA: Diagnosis not present

## 2016-05-09 DIAGNOSIS — B952 Enterococcus as the cause of diseases classified elsewhere: Secondary | ICD-10-CM | POA: Insufficient documentation

## 2016-05-09 DIAGNOSIS — A599 Trichomoniasis, unspecified: Secondary | ICD-10-CM | POA: Diagnosis not present

## 2016-05-09 DIAGNOSIS — N390 Urinary tract infection, site not specified: Secondary | ICD-10-CM | POA: Diagnosis not present

## 2016-05-09 DIAGNOSIS — Z87891 Personal history of nicotine dependence: Secondary | ICD-10-CM | POA: Insufficient documentation

## 2016-05-09 DIAGNOSIS — A549 Gonococcal infection, unspecified: Secondary | ICD-10-CM | POA: Diagnosis not present

## 2016-05-09 DIAGNOSIS — R300 Dysuria: Secondary | ICD-10-CM | POA: Diagnosis present

## 2016-05-09 MED ORDER — LEVOFLOXACIN 500 MG PO TABS: 500.0000 mg | ORAL_TABLET | Freq: Every day | ORAL | 0 refills | Status: AC

## 2016-05-09 MED ORDER — CEFTRIAXONE SODIUM 250 MG IJ SOLR
250.0000 mg | Freq: Once | INTRAMUSCULAR | Status: AC
  Administered 2016-05-09: 250 mg via INTRAMUSCULAR
  Filled 2016-05-09: qty 250

## 2016-05-09 MED ORDER — LIDOCAINE HCL (PF) 1 % IJ SOLN
0.9000 mL | Freq: Once | INTRAMUSCULAR | Status: AC
  Administered 2016-05-09: 0.9 mL
  Filled 2016-05-09: qty 5

## 2016-05-09 MED ORDER — METRONIDAZOLE 500 MG PO TABS
2000.0000 mg | ORAL_TABLET | Freq: Once | ORAL | Status: AC
  Administered 2016-05-09: 2000 mg via ORAL
  Filled 2016-05-09: qty 4

## 2016-05-09 NOTE — ED Triage Notes (Signed)
Pt reports getting a call that she was + for std and needs treatment. No other complaints, no distress is noted.

## 2016-05-09 NOTE — ED Provider Notes (Signed)
[facility] Provider Note   CSN: [id number] Arrival date & time: 05/09/16  1102   By signing my name below, I, Freida Busman, attest that this documentation has been prepared under the direction and in the presence of Janthony Holleman, PA-C. Electronically Signed: Freida Busman, Scribe. 05/09/2016. 12:58 PM.  History   Chief Complaint Chief Complaint  Patient presents with  . SEXUALLY TRANSMITTED DISEASE   The history is provided by the patient. No language interpreter was used.    HPI Comments:  Sally Allen is a 21 y.o. female with a h/o of STIs and PID who presents to the Emergency Department for STD treatment. Pt was seen in the ED on 05/04/2016 for vaginal discharge and dyspareunia. STD testing was obtained at that time. Her wet prep was positive for Trichomonas and bacterial vaginosis. She was discharged with Flagyl, but she has not filled the prescription yet because she is unable to afford the medication.   She was called 2 days ago and informed of positive GC/Chl results and UA culture growing E. Faecalis, and she should return for treatment. Pt is complaining of dysuria with associated tan vaginal discharge, and urinary frequency. She also notes mild suprapubic abdominal pain, vaginal discomfort; describes a pins and needles sensation. She denies fever and chills. Pt is sexually active with 1 female partner but they are not monogamous and they do not use condoms. She reports h/o previous STD/PID that required hospital admission in 2016. She also has a h/o recurrent UTI and  has not been followed by Urology. Pt notes recent treatment for a UTI but cannot recall the name of the antibiotic. Her LMP was in February 2018. Pt had pregnancy test during last ED visit that was negative.   PMH includes anxiety and depression. She reports she is supposed to be taking Seroquel daily, but is not compliant with this medication.   Past Medical History:  Diagnosis Date  . Anxiety   . Depression     . PID (pelvic inflammatory disease)     Patient Active Problem List   Diagnosis Date Noted  . Abdominal pain, RUQ (right upper quadrant) 09/10/2014  . PID (acute pelvic inflammatory disease) 09/09/2014    Past Surgical History:  Procedure Laterality Date  . NO PAST SURGERIES      OB History    Gravida Para Term Preterm AB Living   1         1   SAB TAB Ectopic Multiple Live Births           1       Home Medications    Prior to Admission medications   Medication Sig Start Date End Date Taking? Authorizing Provider  clonazePAM (KLONOPIN) 0.5 MG tablet Take 0.5 mg by mouth daily as needed for anxiety.    Historical Provider, MD  etonogestrel (NEXPLANON) 68 MG IMPL implant 1 each by Subdermal route once.    Historical Provider, MD  ketorolac (TORADOL) 10 MG tablet Take 1 tablet (10 mg total) by mouth every 6 (six) hours as needed. Patient not taking: Reported on 05/04/2016 03/04/16   Joycie Peek, PA-C  lamoTRIgine (LAMICTAL) 25 MG tablet Take 50 mg by mouth daily.    Historical Provider, MD  levofloxacin (LEVAQUIN) 500 MG tablet Take 1 tablet (500 mg total) by mouth daily. 05/09/16   Josselyn Harkins A Tondra Reierson, PA-C  metroNIDAZOLE (FLAGYL) 500 MG tablet Take 1 tablet (500 mg total) by mouth 2 (two) times daily. 05/04/16   Neysa Bonito  Liu, MD  QUEtiapine (SEROQUEL) 100 MG tablet Take 100 mg by mouth at bedtime.    Historical Provider, MD  sertraline (ZOLOFT) 100 MG tablet Take 100 mg by mouth daily.    Historical Provider, MD    Family History History reviewed. No pertinent family history.  Social History Social History  Substance Use Topics  . Smoking status: Former Smoker    Packs/day: 0.00    Types: Cigarettes  . Smokeless tobacco: Never Used  . Alcohol use No     Allergies   Adderall [amphetamine-dextroamphetamine]   Review of Systems Review of Systems  Constitutional: Negative for activity change, chills and fever.  Respiratory: Negative for shortness of breath.    Cardiovascular: Negative for chest pain.  Gastrointestinal: Negative for abdominal pain and diarrhea.  Genitourinary: Positive for dysuria, frequency, vaginal discharge and vaginal pain. Negative for vaginal bleeding.  Musculoskeletal: Negative for back pain.  Skin: Negative for rash.  Allergic/Immunologic: Negative for immunocompromised state.   Physical Exam Updated Vital Signs BP 113/72 (BP Location: Right Arm)   Pulse (!) 51   Temp 98.7 F (37.1 C) (Oral)   Resp 16   SpO2 100%   Physical Exam  Constitutional: Vital signs are normal. She appears well-developed and well-nourished.  Non-toxic appearance. She does not have a sickly appearance. She does not appear ill. No distress.  HENT:  Head: Normocephalic and atraumatic.  Eyes: Conjunctivae are normal.  Neck: Neck supple.  Cardiovascular: Normal rate, regular rhythm and normal heart sounds.  Exam reveals no gallop and no friction rub.   No murmur heard. Pulmonary/Chest: Effort normal and breath sounds normal. No respiratory distress. She has no wheezes. She has no rales.  Abdominal: Soft. Bowel sounds are normal. She exhibits no distension. There is tenderness. There is no guarding.  TTP over the RUQ and suprapubic areas. No rebound or guarding. Normoactive BS. No CVA tenderness.   Musculoskeletal: She exhibits no edema or tenderness.  Neurological: She is alert.  Skin: Skin is warm and dry. No rash noted. She is not diaphoretic.  Psychiatric: Her behavior is normal.  Nursing note and vitals reviewed.  ED Treatments / Results  DIAGNOSTIC STUDIES:  Oxygen Saturation is 100% on RA, normal by my interpretation.    COORDINATION OF CARE:  12:50 PM Discussed treatment plan with pt at bedside and pt agreed to plan.  Labs (all labs ordered are listed, but only abnormal results are displayed) Labs Reviewed - No data to display  EKG  EKG Interpretation None       Radiology No results found.  Procedures Procedures  (including critical care time)  Medications Ordered in ED Medications  cefTRIAXone (ROCEPHIN) injection 250 mg (250 mg Intramuscular Given 05/09/16 1333)  lidocaine (PF) (XYLOCAINE) 1 % injection 0.9 mL (0.9 mLs Other Given 05/09/16 1333)  metroNIDAZOLE (FLAGYL) tablet 2,000 mg (2,000 mg Oral Given 05/09/16 1344)     Initial Impression / Assessment and Plan / ED Course  I have reviewed the triage vital signs and the nursing notes.  Pertinent labs & imaging results that were available during my care of the patient were reviewed by me and considered in my medical decision making (see chart for details).     Well-appearing 21 year old female with a h/o of PID presents for treatment after urine culture grew pan-sensitive E. faecalis as well as positive GC/Chlamydia from 4/2 ED visit. Patient has also not initiated treatment BV and trichomoniasis noted on wet prep from her previous visit because she  was unable to afford the medication. VSS; normotensive, non-tachy, afebrile. No concern for PID at this time.   There is considerate concern that the patient will not be compliant with outpatient treatment. Discussed with pharmacy options for antibiotics that would cross-cover all pathogens. Discussed options with the patient. Will treat the patient in the ED with ceftriaxone and metronidazole. Will d/c the patient with a prescription for Levaquin. Pt advised on safe sex practices. Patient advised not to drink alcohol after taking metronidazole. Discussed return precautions. Pt appears safe for discharge.   Final Clinical Impressions(s) / ED Diagnoses   Final diagnoses:  Gonorrhea  Chlamydia  Trichimoniasis  UTI (urinary tract infection) due to Enterococcus    New Prescriptions Discharge Medication List as of 05/09/2016  1:40 PM    START taking these medications   Details  levofloxacin (LEVAQUIN) 500 MG tablet Take 1 tablet (500 mg total) by mouth daily., Starting Sat 05/09/2016, Print       I  personally performed the services described in this documentation, which was scribed in my presence. The recorded information has been reviewed and is accurate.     Barkley Boards, PA-C 05/11/16 1527    Arby Barrette, MD 05/12/16 [phone]

## 2016-05-09 NOTE — Discharge Instructions (Signed)
You have been treated in the emergency department today with metronidazole and Rocephin. Please pick Your prescription for Levaquin and take once daily for the next 7 days. A referral for urology is attached with this paperwork. Please call to make an appointment. Please return to the emergency department for new or worsening symptoms.

## 2016-07-18 ENCOUNTER — Encounter (HOSPITAL_COMMUNITY): Payer: Self-pay | Admitting: Emergency Medicine

## 2016-07-18 ENCOUNTER — Emergency Department (HOSPITAL_COMMUNITY)
Admission: EM | Admit: 2016-07-18 | Discharge: 2016-07-18 | Disposition: A | Discharge status: Home / Self Care | Payer: BLUE CROSS/BLUE SHIELD | Attending: Emergency Medicine | Admitting: Emergency Medicine

## 2016-07-18 DIAGNOSIS — Z202 Contact with and (suspected) exposure to infections with a predominantly sexual mode of transmission: Secondary | ICD-10-CM | POA: Diagnosis not present

## 2016-07-18 DIAGNOSIS — Z87891 Personal history of nicotine dependence: Secondary | ICD-10-CM | POA: Diagnosis not present

## 2016-07-18 DIAGNOSIS — F419 Anxiety disorder, unspecified: Secondary | ICD-10-CM | POA: Insufficient documentation

## 2016-07-18 DIAGNOSIS — Z79899 Other long term (current) drug therapy: Secondary | ICD-10-CM | POA: Insufficient documentation

## 2016-07-18 LAB — POC URINE PREG, ED: PREG TEST UR: NEGATIVE

## 2016-07-18 MED ORDER — LIDOCAINE HCL (PF) 1 % IJ SOLN
0.9000 mL | Freq: Once | INTRAMUSCULAR | Status: AC
  Administered 2016-07-18: 0.9 mL

## 2016-07-18 MED ORDER — STERILE WATER FOR INJECTION IJ SOLN: 0.9000 mL | Freq: Once | INTRAMUSCULAR | Status: DC

## 2016-07-18 MED ORDER — LIDOCAINE HCL (PF) 1 % IJ SOLN: 5.0000 mL | Freq: Once | INTRAMUSCULAR | Status: DC

## 2016-07-18 MED ORDER — CEFTRIAXONE SODIUM 250 MG IJ SOLR
250.0000 mg | Freq: Once | INTRAMUSCULAR | Status: AC
  Administered 2016-07-18: 250 mg via INTRAMUSCULAR
  Filled 2016-07-18: qty 250

## 2016-07-18 MED ORDER — AZITHROMYCIN 250 MG PO TABS
1000.0000 mg | ORAL_TABLET | Freq: Once | ORAL | Status: AC
  Administered 2016-07-18: 1000 mg via ORAL
  Filled 2016-07-18: qty 4

## 2016-07-18 MED ORDER — LIDOCAINE HCL (PF) 1 % IJ SOLN
INTRAMUSCULAR | Status: AC
  Filled 2016-07-18: qty 5

## 2016-07-18 MED ORDER — LIDOCAINE HCL (PF) 1 % IJ SOLN: 0.9000 mL | Freq: Once | INTRAMUSCULAR | Status: DC

## 2016-07-18 NOTE — ED Provider Notes (Signed)
[facility]-EMERGENCY DEPT Provider Note   CSN: [id number] Arrival date & time: 07/18/16  1600     History   Chief Complaint Chief Complaint  Patient presents with  . Exposure to STD    HPI Sally Allen is a 21 y.o. female.  Patient c/o boyfriend indicating he had a penile discharge in last week.  No known std or specific dx.  Patient indicates he wants treatment. Pt denies any symptoms. No abd or pelvic pain. No vaginal discharge or bleeding. No dysuria or gu c/o. No fever or chills.    The history is provided by the patient.  Exposure to STD  Pertinent negatives include no abdominal pain.    Past Medical History:  Diagnosis Date  . Anxiety   . Depression   . PID (pelvic inflammatory disease)     Patient Active Problem List   Diagnosis Date Noted  . Abdominal pain, RUQ (right upper quadrant) 09/10/2014  . PID (acute pelvic inflammatory disease) 09/09/2014    Past Surgical History:  Procedure Laterality Date  . NO PAST SURGERIES      OB History    Gravida Para Term Preterm AB Living   1         1   SAB TAB Ectopic Multiple Live Births           1       Home Medications    Prior to Admission medications   Medication Sig Start Date End Date Taking? Authorizing Provider  clonazePAM (KLONOPIN) 0.5 MG tablet Take 0.5 mg by mouth daily as needed for anxiety.    [provider]  etonogestrel (NEXPLANON) 68 MG IMPL implant 1 each by Subdermal route once.    [provider]  ketorolac (TORADOL) 10 MG tablet Take 1 tablet (10 mg total) by mouth every 6 (six) hours as needed. Patient not taking: Reported on 05/04/2016 03/04/16   Joycie Peek, PA-C  lamoTRIgine (LAMICTAL) 25 MG tablet Take 50 mg by mouth daily.    [provider]  levofloxacin (LEVAQUIN) 500 MG tablet Take 1 tablet (500 mg total) by mouth daily. 05/09/16   McDonald, Mia A, PA-C  metroNIDAZOLE (FLAGYL) 500 MG tablet Take 1 tablet (500 mg total) by mouth 2 (two) times daily. 05/04/16    Lavera Guise, MD  QUEtiapine (SEROQUEL) 100 MG tablet Take 100 mg by mouth at bedtime.    [provider]  sertraline (ZOLOFT) 100 MG tablet Take 100 mg by mouth daily.    [provider]    Family History No family history on file.  Social History Social History  Substance Use Topics  . Smoking status: Former Smoker    Packs/day: 0.00    Types: Cigarettes  . Smokeless tobacco: Never Used  . Alcohol use No     Allergies   Adderall [amphetamine-dextroamphetamine]   Review of Systems Review of Systems  Constitutional: Negative for chills and fever.  Gastrointestinal: Negative for abdominal pain and vomiting.  Genitourinary: Negative for dysuria, pelvic pain, vaginal bleeding and vaginal discharge.  Skin: Negative for rash.     Physical Exam Updated Vital Signs BP 111/78   Pulse 62   Temp 98.6 F (37 C) (Oral)   Resp 16   LMP 07/07/2016   SpO2 100%   Physical Exam  Constitutional: She appears well-developed and well-nourished. No distress.  HENT:  Head: Atraumatic.  Eyes: Conjunctivae are normal. No scleral icterus.  Neck: Neck supple. No tracheal deviation present.  Cardiovascular: Normal  rate.   Pulmonary/Chest: Effort normal. No respiratory distress.  Abdominal: Soft. Normal appearance. She exhibits no distension. There is no tenderness. There is no guarding.  Genitourinary:  Genitourinary Comments: No cva tenderness  Musculoskeletal: She exhibits no edema.  Neurological: She is alert.  Skin: Skin is warm and dry. No rash noted. She is not diaphoretic.  Psychiatric: She has a normal mood and affect.  Nursing note and vitals reviewed.    ED Treatments / Results  Labs (all labs ordered are listed, but only abnormal results are displayed) Labs Reviewed  POC URINE PREG, ED    EKG  EKG Interpretation None       Radiology No results found.  Procedures Procedures (including critical care time)  Medications Ordered in  ED Medications  cefTRIAXone (ROCEPHIN) injection 250 mg (not administered)  azithromycin (ZITHROMAX) tablet 1,000 mg (not administered)     Initial Impression / Assessment and Plan / ED Course  I have reviewed the triage vital signs and the nursing notes.  Pertinent labs & imaging results that were available during my care of the patient were reviewed by me and considered in my medical decision making (see chart for details).  Discussed evaluation options w pt, including pelvic/vag swabs, testing, and abx treatment.  Pt indicates she is having no symptoms, feels fine, and just requests abx treatment.   Confirmed nkda w pt.   Rocephin im. zithromax po.    Final Clinical Impressions(s) / ED Diagnoses   Final diagnoses:  None    New Prescriptions New Prescriptions   No medications on file     Cathren LaineSteinl, Adelaida Reindel, MD 07/18/16 [phone]

## 2016-07-18 NOTE — ED Triage Notes (Signed)
Pt states "i had sex with someone two days ago and they told me they were leaking discharge but i'm not having any symptoms and they told me to go get checked.". Pt denies symptoms. LMP June 5th.

## 2016-07-18 NOTE — Discharge Instructions (Signed)
It was our pleasure to provide your ER care today.  Return to ER if worse, new symptoms, severe abdominal pain, vaginal discharge, fevers, other concern.

## 2016-08-15 ENCOUNTER — Encounter (HOSPITAL_COMMUNITY): Payer: Self-pay | Admitting: Emergency Medicine

## 2016-08-15 ENCOUNTER — Emergency Department (HOSPITAL_COMMUNITY)
Admission: EM | Admit: 2016-08-15 | Discharge: 2016-08-16 | Disposition: A | Discharge status: Home / Self Care | Payer: BLUE CROSS/BLUE SHIELD | Attending: Emergency Medicine | Admitting: Emergency Medicine

## 2016-08-15 ENCOUNTER — Emergency Department (HOSPITAL_COMMUNITY): Payer: BLUE CROSS/BLUE SHIELD

## 2016-08-15 DIAGNOSIS — M25552 Pain in left hip: Secondary | ICD-10-CM | POA: Insufficient documentation

## 2016-08-15 DIAGNOSIS — N939 Abnormal uterine and vaginal bleeding, unspecified: Secondary | ICD-10-CM | POA: Diagnosis not present

## 2016-08-15 DIAGNOSIS — Z87891 Personal history of nicotine dependence: Secondary | ICD-10-CM | POA: Insufficient documentation

## 2016-08-15 DIAGNOSIS — Z79899 Other long term (current) drug therapy: Secondary | ICD-10-CM | POA: Diagnosis not present

## 2016-08-15 DIAGNOSIS — M25522 Pain in left elbow: Secondary | ICD-10-CM

## 2016-08-15 LAB — I-STAT CHEM 8, ED
BUN: 5 mg/dL — AB (ref 6–20)
CALCIUM ION: 1.19 mmol/L (ref 1.15–1.40)
CHLORIDE: 102 mmol/L (ref 101–111)
CREATININE: 0.8 mg/dL (ref 0.44–1.00)
Glucose, Bld: 99 mg/dL (ref 65–99)
HCT: 36 % (ref 36.0–46.0)
Hemoglobin: 12.2 g/dL (ref 12.0–15.0)
Potassium: 3.5 mmol/L (ref 3.5–5.1)
SODIUM: 143 mmol/L (ref 135–145)
TCO2: 26 mmol/L (ref 0–100)

## 2016-08-15 LAB — I-STAT BETA HCG BLOOD, ED (MC, WL, AP ONLY): I-stat hCG, quantitative: <5 m[IU]/mL (ref <5)

## 2016-08-15 MED ORDER — KETOROLAC TROMETHAMINE 60 MG/2ML IM SOLN
30.0000 mg | Freq: Once | INTRAMUSCULAR | Status: AC
  Administered 2016-08-15: 30 mg via INTRAMUSCULAR
  Filled 2016-08-15: qty 2

## 2016-08-15 NOTE — ED Triage Notes (Signed)
C/o left elbow pain for last three days.  Also reports being on period for the last three weeks and would like to be checked out for that as well.

## 2016-08-16 NOTE — Discharge Instructions (Signed)
Rest, Ice intermittently (in the first 24-48 hours), Gentle compression with an Ace wrap, and elevate (Limb above the level of the heart)   Take up to 800mg  of ibuprofen (that is usually 4 over the counter pills)  3 times a day for 5 days. Take with food.  Do not hesitate to return to the emergency department for any new, worsening or concerning symptoms.

## 2016-08-16 NOTE — ED Provider Notes (Signed)
[facility]ER[facility]PT Provider Note   CSN: [id number] Arrival date & time: 08/15/16  2047     History   Chief Complaint Chief Complaint  Patient presents with  . left elbow pain  . Vaginal Bleeding    HPI   Blood pressure 105/70, pulse 78, temperature 98.6 F (37 C), temperature source Oral, resp. rate 16, height 5\' 5"  (1.651 m), weight 59 kg (130 lb), last menstrual period 07/20/2016, SpO2 100 %.  Sally Allen is a 21 y.o. female complaining of atraumatic left elbow pain onset 2 days ago. 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 She states that the pain is severe and exacerbated by movement, palpation and flexing the elbow. She is taking no pain medication prior to arrival. She never had a similar pain in the past. Patient also notes the she's had been having vaginal bleeding off and on for the last 3 weeks. She states that typically she does have irregular periods, she states that she is been changing her pad 3-4 times a day. She denies chest pain, shortness of breath, palpitations, syncope. She's never required transfusion in the past. She states that she does have Implanon [facility]. She hasn't had her regular OB/GYN.  Past Medical History:  Diagnosis Date  . Anxiety   . Depression   . PID (pelvic inflammatory disease)     Patient Active Problem List   Diagnosis Date Noted  . Abdominal pain, RUQ (right upper quadrant) 09/10/2014  . PID (acute pelvic inflammatory disease) 09/09/2014    Past Surgical History:  Procedure Laterality Date  . NO PAST SURGERIES      OB History    Gravida Para Term Preterm AB Living   1         1   SAB TAB Ectopic Multiple Live Births           1       Home Medications    Prior to Admission medications   Medication Sig Start Date End Date Taking? Authorizing Provider  clonazePAM (KLONOPIN) 0.5 MG  tablet Take 0.5 mg by mouth daily as needed for anxiety.    [provider]  etonogestrel (NEXPLANON) 68 MG IMPL implant 1 each by Subdermal route once.    [provider]  ketorolac (TORADOL) 10 MG tablet Take 1 tablet (10 mg total) by mouth every 6 (six) hours as needed. Patient not taking: Reported on 05/04/2016 03/04/16   Joycie Peek, PA-C  lamoTRIgine (LAMICTAL) 25 MG tablet Take 50 mg by mouth daily.    [provider]  levofloxacin (LEVAQUIN) 500 MG tablet Take 1 tablet (500 mg total) by mouth daily. 05/09/16   McDonald, Mia A, PA-C  metroNIDAZOLE (FLAGYL) 500 MG tablet Take 1 tablet (500 mg total) by mouth 2 (two) times daily. 05/04/16   Lavera Guise, MD  QUEtiapine (SEROQUEL) 100 MG tablet Take 100 mg by mouth at bedtime.    [provider]  sertraline (ZOLOFT) 100 MG tablet Take 100 mg by mouth daily.    [provider]    Family History No family history on file.  Social History Social History  Substance Use Topics  . Smoking status: Former Smoker    Packs/day: 0.00    Types: Cigarettes  . Smokeless tobacco: Never Used  . Alcohol use No     Allergies   Adderall [  amphetamine-dextroamphetamine]   Review of Systems Review of Systems  A complete review of systems was obtained and all systems are negative except as noted in the HPI and PMH.   Physical Exam Updated Vital Signs BP 105/77 (BP Location: Right Arm)   Pulse (!) 50   Temp 98.6 F (37 C) (Oral)   Resp 16   Ht 5\' 5"  (1.651 m)   Wt 59 kg (130 lb)   LMP 07/20/2016   SpO2 99%   BMI 21.63 kg/m   Physical Exam  Constitutional: She is oriented to person, place, and time. She appears well-developed and well-nourished. No distress.  HENT:  Head: Normocephalic and atraumatic.  Mouth/Throat: Oropharynx is clear and moist.  Eyes: Pupils are equal, round, and reactive to light. Conjunctivae and EOM are normal.  Neck: Normal range of motion.  Cardiovascular: Normal  rate, regular rhythm and intact distal pulses.   Pulmonary/Chest: Effort normal and breath sounds normal.  Abdominal: Soft. There is no tenderness.  Musculoskeletal: She exhibits edema and tenderness.  Swelling and tenderness just proximal to the olecranon process with no overlying skin changes. Patient holds her left arm in partial flexion; she is distally neurovascular intact.  Neurological: She is alert and oriented to person, place, and time.  Skin: She is not diaphoretic.  Psychiatric: She has a normal mood and affect.  Nursing note and vitals reviewed.    ED Treatments / Results  Labs (all labs ordered are listed, but only abnormal results are displayed) Labs Reviewed  I-STAT CHEM 8, ED - Abnormal; Notable for the following:       Result Value   BUN 5 (*)    All other components within normal limits  I-STAT BETA HCG BLOOD, ED ([facility], [facility], [facility] ONLY)    EKG  EKG Interpretation None       Radiology Dg Elbow Complete Left  Result Date: 08/15/2016 CLINICAL DATA:  21 year old female with pain in the left elbow after doing pushups 5 days ago. EXAM: LEFT ELBOW - COMPLETE 3+ VIEW COMPARISON:  None. FINDINGS: Lateral projection in there is an unusual linear osseous density posterior to the olecranon process which appears to represent a partially avulsed portion of the cortex at the site of the triceps muscle insertion. Overlying soft tissue swelling is also noted. Bones of the elbow otherwise appear intact. IMPRESSION: 1. Incomplete avulsion fracture at the triceps muscle insertion on the olecranon process, as above. Electronically Signed   By: Trudie Reedaniel  Entrikin M.D.   On: 08/15/2016 21:58    Procedures Procedures (including critical care time)  Medications Ordered in ED Medications  ketorolac (TORADOL) injection 30 mg (30 mg Intramuscular Given 08/15/16 2357)     Initial Impression / Assessment and Plan / ED Course  I have reviewed the triage vital signs and the nursing  notes.  Pertinent labs & imaging results that were available during my care of the patient were reviewed by me and considered in my medical decision making (see chart for details).     Vitals:   08/15/16 2101 08/16/16 0015  BP: 105/70 105/77  Pulse: 78 (!) 50  Resp: 16 16  Temp: 98.6 F (37 C)   TempSrc: Oral   SpO2: 100% 99%  Weight: 59 kg (130 lb)   Height: 5\' 5"  (1.651 m)     Medications  ketorolac (TORADOL) injection 30 mg (30 mg Intramuscular Given 08/15/16 2357)    Sally Allen is 21 y.o. female presenting with Left (dominant) elbow pain is  atraumatic. There is mild swelling in tenderness palpation, she has severe pain with flexion. Also reporting vaginal bleeding for the last 3 weeks. This is not appear to be a brisk bleed. No signs of symptomatic anemia. Pregnancy test is negative and there is no anemia seen on i-STAT chem 8. X-ray of the left elbow shows a incomplete avulsion fracture at the triceps muscle insertion, I have reviewed this with attending physician he states it appears more of a calcific tendinitis which would correlate better with the history of the illness. Patient is given Toradol, recommend ice, I will write her a work note and give her an orthopedic follow-up and also referral to [facility] for dysfunction uterine bleeding.  Evaluation does not show pathology that would require ongoing emergent intervention or inpatient treatment. Pt is hemodynamically stable and mentating appropriately. Discussed findings and plan with patient/guardian, who agrees with care plan. All questions answered. Return precautions discussed and outpatient follow up given.      Final Clinical Impressions(s) / ED Diagnoses   Final diagnoses:  Left elbow pain  Vaginal bleeding    New Prescriptions Discharge Medication List as of 08/16/2016 12:28 AM       Ercel Pepitone, Mardella Layman 08/16/16 0059    Dione Booze, MD 08/16/16 [phone]

## 2018-01-17 IMAGING — CR DG ELBOW COMPLETE 3+V*L*
4 series · 4 of 4 positions shown · non-contrast
Comparison: None.

CLINICAL DATA: 21-year-old female with pain in the left elbow after
doing pushups 5 days ago.

EXAM:
LEFT ELBOW - COMPLETE 3+ VIEW

[elbow ap]
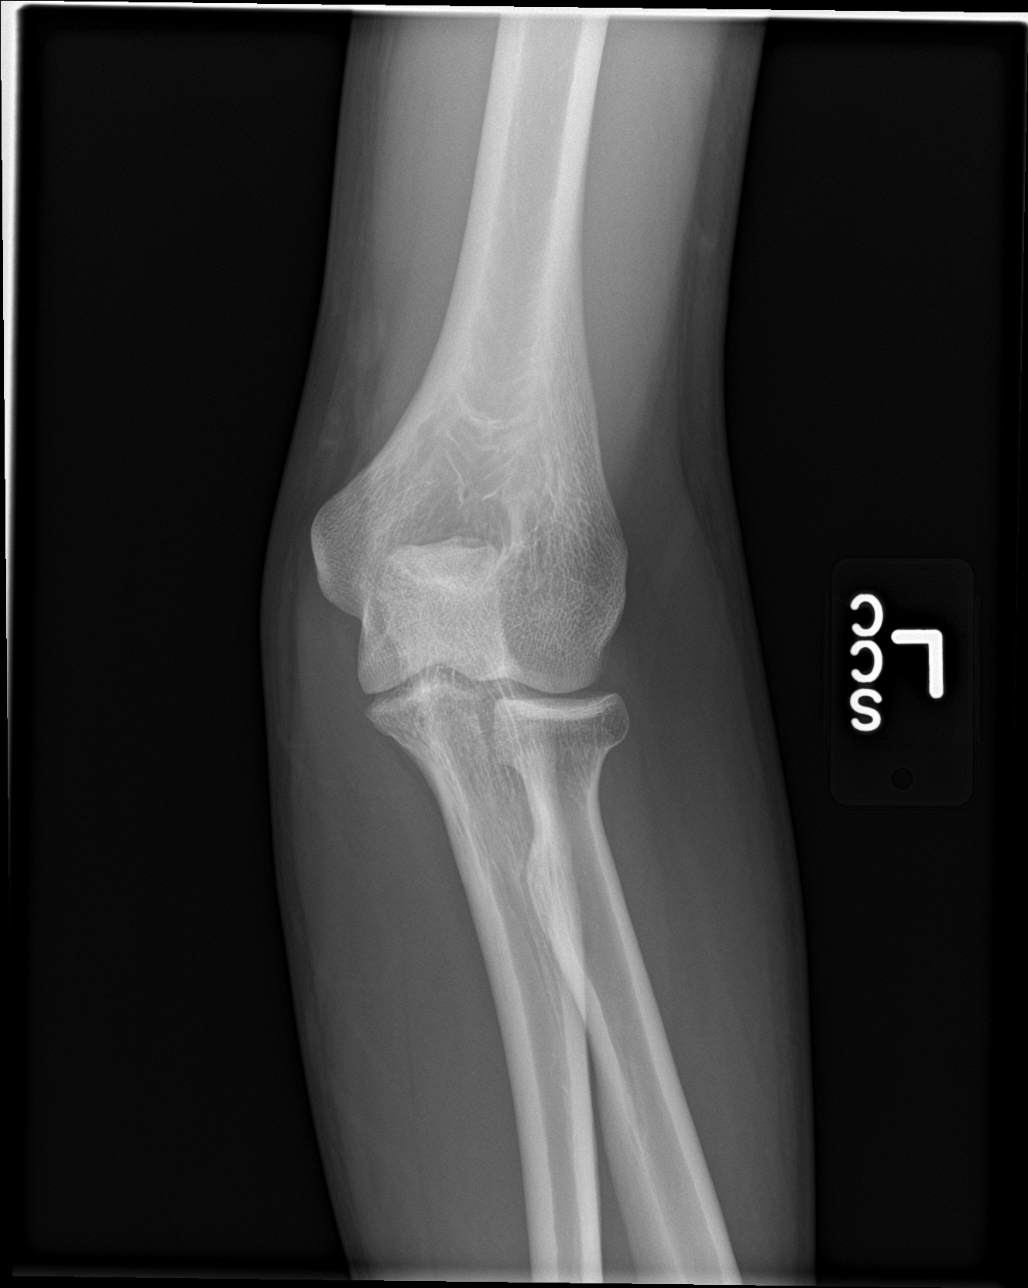

[elbow obl (1 of 2)]
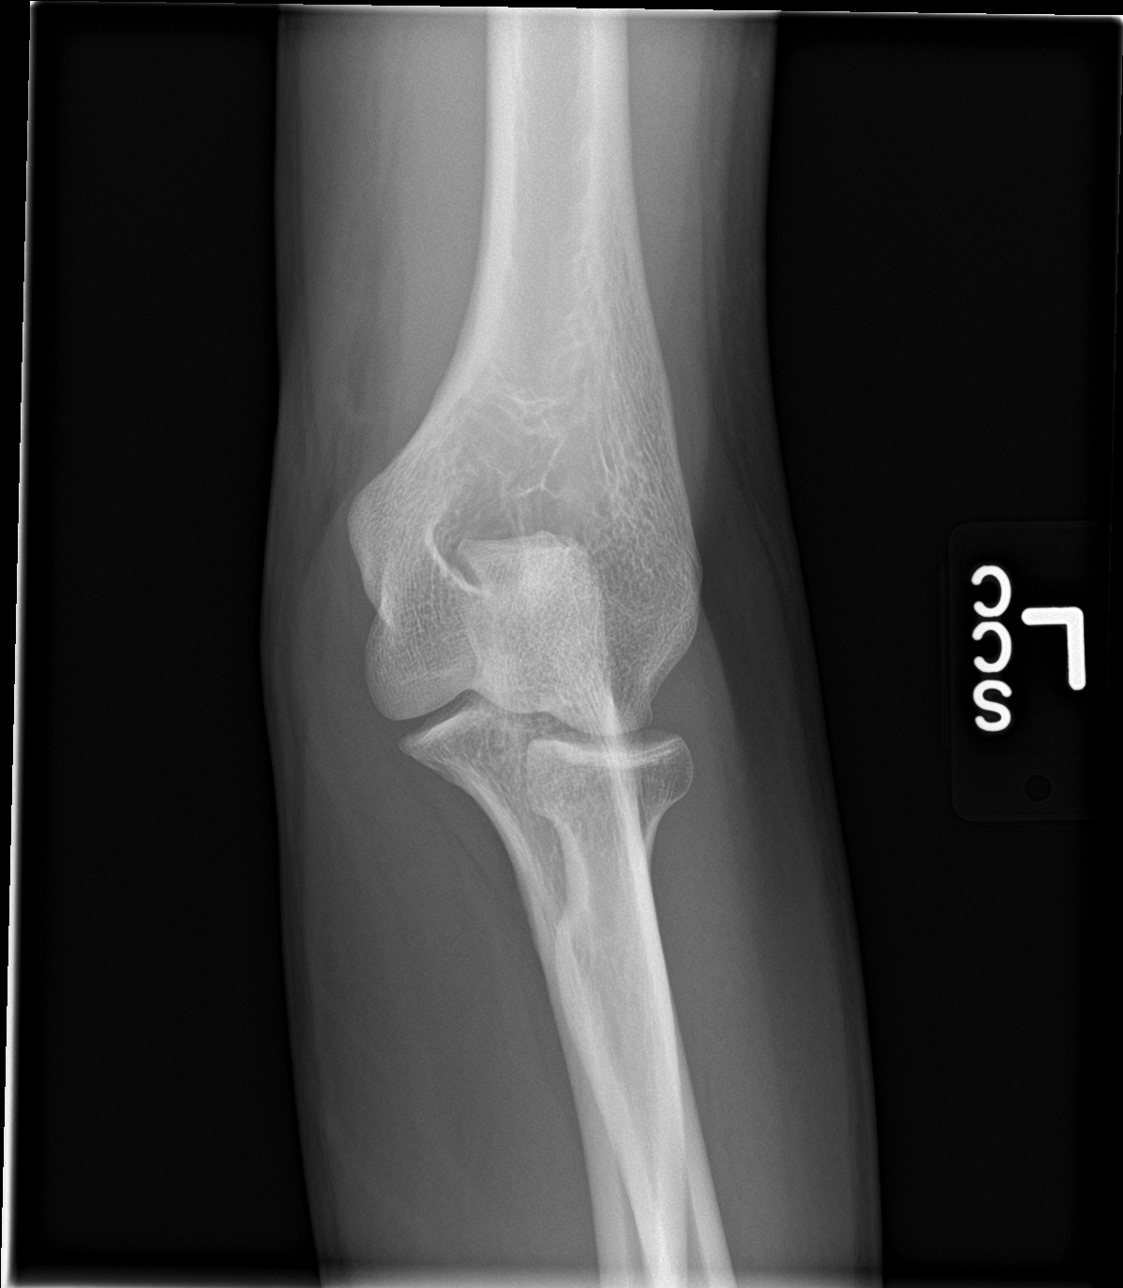

[elbow obl (2 of 2)]
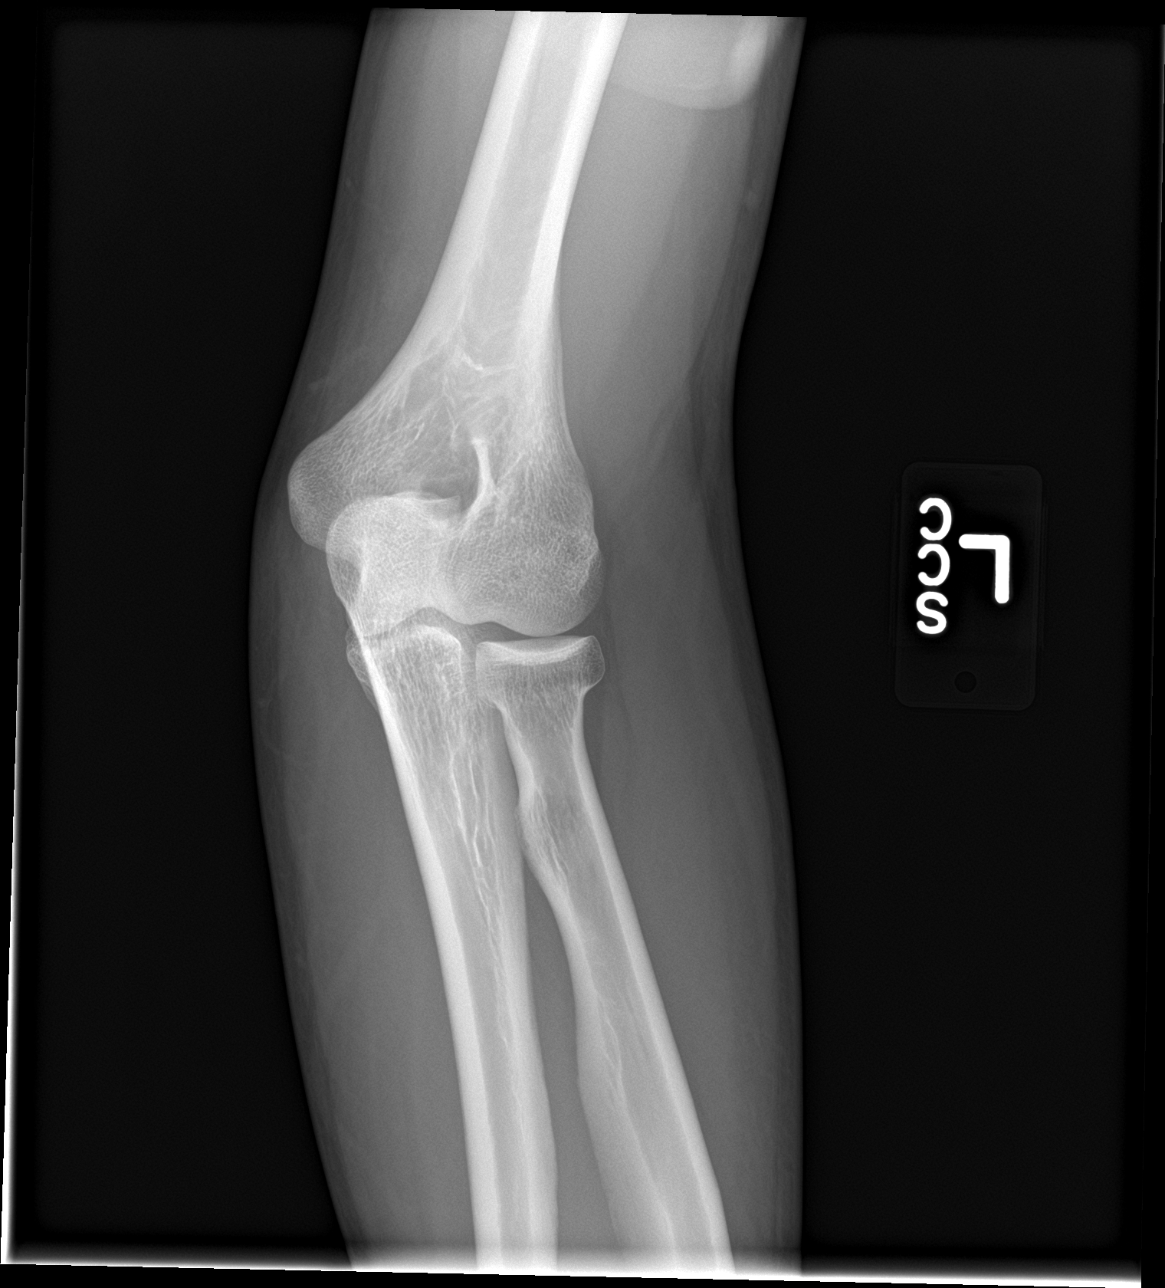

[elbow lat]
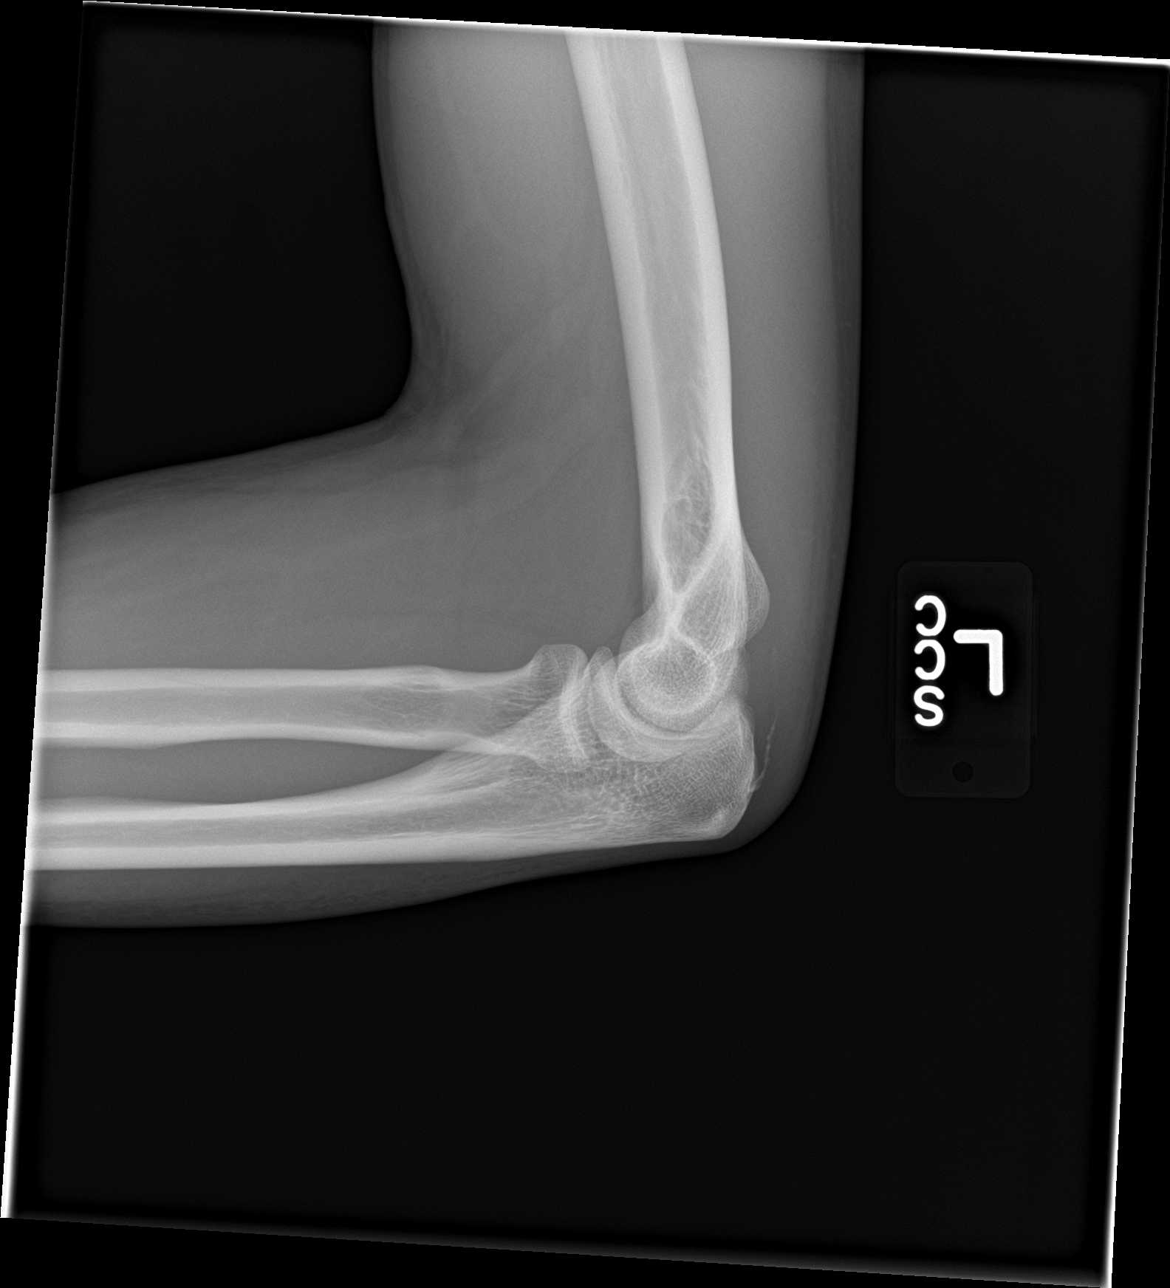

[4 of 4 positions shown; findings below may reference images not displayed]

FINDINGS: Lateral projection in there is an unusual linear osseous density
posterior to the olecranon process which appears to represent a
partially avulsed portion of the cortex at the site of the triceps
muscle insertion. Overlying soft tissue swelling is also noted.
Bones of the elbow otherwise appear intact.
IMPRESSION: 1. Incomplete avulsion fracture at the triceps muscle insertion on
the olecranon process, as above.
